# Patient Record
Sex: Female | Born: 1945 | Race: White | Hispanic: No | Marital: Married | Smoking: Never smoker
Health system: Southern US, Community
[De-identification: ages and names within clinical notes are randomized; demographics above are authoritative.]

## PROBLEM LIST (undated history)

## (undated) DIAGNOSIS — I10 Essential (primary) hypertension: Secondary | ICD-10-CM

## (undated) HISTORY — PX: OTHER SURGICAL HISTORY: SHX169

---

## 2015-06-10 ENCOUNTER — Encounter: Payer: Self-pay | Admitting: Podiatry

## 2015-06-10 ENCOUNTER — Ambulatory Visit (INDEPENDENT_AMBULATORY_CARE_PROVIDER_SITE_OTHER): Payer: Medicare Other

## 2015-06-10 ENCOUNTER — Ambulatory Visit (INDEPENDENT_AMBULATORY_CARE_PROVIDER_SITE_OTHER): Payer: Medicare Other | Admitting: Podiatry

## 2015-06-10 VITALS — BP 143/79 | HR 73 | Resp 16

## 2015-06-10 DIAGNOSIS — S99921A Unspecified injury of right foot, initial encounter: Secondary | ICD-10-CM

## 2015-06-10 DIAGNOSIS — S92911A Unspecified fracture of right toe(s), initial encounter for closed fracture: Secondary | ICD-10-CM

## 2015-06-10 MED ORDER — TRAMADOL HCL 50 MG PO TABS: 50.0000 mg | ORAL_TABLET | Freq: Three times a day (TID) | ORAL | Status: DC | PRN

## 2015-06-10 NOTE — Progress Notes (Signed)
   Subjective:    Patient ID: Lisa RivalSandra Parsons, female    DOB: 08/09/1945, 69 y.o.   MRN: [id number]  HPI: She presents today as a 69 year old white female here from [place] visiting her daughter for Christmas. She states that she stepped over her granddaughter last night tripping and landing on her foot. She states that her foot hurts so badly it throbs and is painful. She states that she can hardly touch it and she is afraid that she is not to have any relief and not be able to drive back to [place].    Review of Systems  Musculoskeletal: Positive for gait problem.  All other systems reviewed and are negative.      Objective:   Physical Exam: 69 year old white female who presents today utilizing a wheelchair. Pulses are palpable vital signs are stable she is alert and oriented 3. Edema to the right lower extremity and the foot dorsal and dorsolateral aspect of the foot. Radiographs taken today do demonstrate a fracture of the fifth toe and the neck of the fifth metatarsal which appears to be nondisplaced and non-comminuted. No skin breakdown is noted. Mild ecchymosis overlying the fifth toe at the proximal interphalangeal joint.        Assessment & Plan:  Fractured fifth toe and fifth metatarsal nondisplaced non-comminuted.  Plan: Discussed etiology pathology conservative versus surgical therapies. At this point I redressed the foot with compression dressing and placed her in a short Cam Walker. We will also obtain a Darco shoe or her drive back to [place]. I wrote her a prescription for Ultram 50 mg. I will follow-up with her as needed prior to her departure for [place].

## 2015-06-12 DIAGNOSIS — M79673 Pain in unspecified foot: Secondary | ICD-10-CM

## 2020-04-10 ENCOUNTER — Emergency Department (HOSPITAL_COMMUNITY): Payer: Medicare Other

## 2020-04-10 ENCOUNTER — Inpatient Hospital Stay (HOSPITAL_COMMUNITY)
Admission: EM | Admit: 2020-04-10 | Discharge: 2020-04-13 | DRG: 661 | Disposition: A | Discharge status: Home / Self Care | Payer: Medicare Other | Attending: Internal Medicine | Admitting: Internal Medicine

## 2020-04-10 ENCOUNTER — Encounter (HOSPITAL_COMMUNITY): Payer: Self-pay | Admitting: *Deleted

## 2020-04-10 ENCOUNTER — Inpatient Hospital Stay (HOSPITAL_COMMUNITY): Payer: Medicare Other | Admitting: Anesthesiology

## 2020-04-10 ENCOUNTER — Other Ambulatory Visit: Payer: Self-pay

## 2020-04-10 ENCOUNTER — Encounter (HOSPITAL_COMMUNITY)
Admission: EM | Disposition: A | Discharge status: Home / Self Care | Payer: Self-pay | Source: Home / Self Care | Attending: Internal Medicine

## 2020-04-10 ENCOUNTER — Inpatient Hospital Stay (HOSPITAL_COMMUNITY): Payer: Medicare Other

## 2020-04-10 DIAGNOSIS — Z91041 Radiographic dye allergy status: Secondary | ICD-10-CM

## 2020-04-10 DIAGNOSIS — K5900 Constipation, unspecified: Secondary | ICD-10-CM | POA: Diagnosis present

## 2020-04-10 DIAGNOSIS — Z79899 Other long term (current) drug therapy: Secondary | ICD-10-CM | POA: Diagnosis not present

## 2020-04-10 DIAGNOSIS — N201 Calculus of ureter: Secondary | ICD-10-CM | POA: Diagnosis not present

## 2020-04-10 DIAGNOSIS — Z880 Allergy status to penicillin: Secondary | ICD-10-CM | POA: Diagnosis not present

## 2020-04-10 DIAGNOSIS — Z8249 Family history of ischemic heart disease and other diseases of the circulatory system: Secondary | ICD-10-CM | POA: Diagnosis not present

## 2020-04-10 DIAGNOSIS — Z91013 Allergy to seafood: Secondary | ICD-10-CM

## 2020-04-10 DIAGNOSIS — D72829 Elevated white blood cell count, unspecified: Secondary | ICD-10-CM | POA: Diagnosis present

## 2020-04-10 DIAGNOSIS — Z20822 Contact with and (suspected) exposure to covid-19: Secondary | ICD-10-CM | POA: Diagnosis present

## 2020-04-10 DIAGNOSIS — E669 Obesity, unspecified: Secondary | ICD-10-CM | POA: Diagnosis present

## 2020-04-10 DIAGNOSIS — N179 Acute kidney failure, unspecified: Secondary | ICD-10-CM | POA: Diagnosis present

## 2020-04-10 DIAGNOSIS — Z90710 Acquired absence of both cervix and uterus: Secondary | ICD-10-CM

## 2020-04-10 DIAGNOSIS — I10 Essential (primary) hypertension: Secondary | ICD-10-CM | POA: Diagnosis present

## 2020-04-10 DIAGNOSIS — Z6829 Body mass index (BMI) 29.0-29.9, adult: Secondary | ICD-10-CM | POA: Diagnosis not present

## 2020-04-10 DIAGNOSIS — R109 Unspecified abdominal pain: Secondary | ICD-10-CM | POA: Diagnosis present

## 2020-04-10 DIAGNOSIS — Z87442 Personal history of urinary calculi: Secondary | ICD-10-CM

## 2020-04-10 DIAGNOSIS — Z419 Encounter for procedure for purposes other than remedying health state, unspecified: Secondary | ICD-10-CM

## 2020-04-10 DIAGNOSIS — N132 Hydronephrosis with renal and ureteral calculous obstruction: Principal | ICD-10-CM | POA: Diagnosis present

## 2020-04-10 DIAGNOSIS — F419 Anxiety disorder, unspecified: Secondary | ICD-10-CM | POA: Diagnosis present

## 2020-04-10 DIAGNOSIS — K5730 Diverticulosis of large intestine without perforation or abscess without bleeding: Secondary | ICD-10-CM | POA: Diagnosis present

## 2020-04-10 HISTORY — DX: Essential (primary) hypertension: I10

## 2020-04-10 HISTORY — PX: CYSTOSCOPY W/ URETERAL STENT PLACEMENT: SHX1429

## 2020-04-10 LAB — CBC WITH DIFFERENTIAL/PLATELET
Abs Immature Granulocytes: 0.05 10*3/uL (ref 0.00–0.07)
Basophils Absolute: 0.1 10*3/uL (ref 0.0–0.1)
Basophils Relative: 1 %
Eosinophils Absolute: 0 10*3/uL (ref 0.0–0.5)
Eosinophils Relative: 0 %
HCT: 43.3 % (ref 36.0–46.0)
Hemoglobin: 14.4 g/dL (ref 12.0–15.0)
Immature Granulocytes: 1 %
Lymphocytes Relative: 10 %
Lymphs Abs: 1.1 10*3/uL (ref 0.7–4.0)
MCH: 32.4 pg (ref 26.0–34.0)
MCHC: 33.3 g/dL (ref 30.0–36.0)
MCV: 97.3 fL (ref 80.0–100.0)
Monocytes Absolute: 1 10*3/uL (ref 0.1–1.0)
Monocytes Relative: 9 %
Neutro Abs: 8.9 10*3/uL — ABNORMAL HIGH (ref 1.7–7.7)
Neutrophils Relative %: 79 %
Platelets: 228 10*3/uL (ref 150–400)
RBC: 4.45 MIL/uL (ref 3.87–5.11)
RDW: 12.5 % (ref 11.5–15.5)
WBC: 11.1 10*3/uL — ABNORMAL HIGH (ref 4.0–10.5)
nRBC: 0 % (ref 0.0–0.2)

## 2020-04-10 LAB — COMPREHENSIVE METABOLIC PANEL
ALT: 31 U/L (ref 0–44)
AST: 30 U/L (ref 15–41)
Albumin: 3.8 g/dL (ref 3.5–5.0)
Alkaline Phosphatase: 77 U/L (ref 38–126)
Anion gap: 10 (ref 5–15)
BUN: 16 mg/dL (ref 8–23)
CO2: 24 mmol/L (ref 22–32)
Calcium: 9.5 mg/dL (ref 8.9–10.3)
Chloride: 103 mmol/L (ref 98–111)
Creatinine, Ser: 1.1 mg/dL — ABNORMAL HIGH (ref 0.44–1.00)
GFR, Estimated: 53 mL/min — ABNORMAL LOW (ref >60)
Glucose, Bld: 157 mg/dL — ABNORMAL HIGH (ref 70–99)
Potassium: 3.8 mmol/L (ref 3.5–5.1)
Sodium: 137 mmol/L (ref 135–145)
Total Bilirubin: 0.4 mg/dL (ref 0.3–1.2)
Total Protein: 6.9 g/dL (ref 6.5–8.1)

## 2020-04-10 LAB — URINALYSIS, ROUTINE W REFLEX MICROSCOPIC
Bacteria, UA: NONE SEEN
Bilirubin Urine: NEGATIVE
Glucose, UA: 50 mg/dL — AB
Ketones, ur: NEGATIVE mg/dL
Leukocytes,Ua: NEGATIVE
Nitrite: NEGATIVE
Protein, ur: NEGATIVE mg/dL
RBC / HPF: >50 RBC/hpf — ABNORMAL HIGH (ref 0–5)
Specific Gravity, Urine: 1.011 (ref 1.005–1.030)
pH: 6 (ref 5.0–8.0)

## 2020-04-10 LAB — RESPIRATORY PANEL BY RT PCR (FLU A&B, COVID)
Influenza A by PCR: NEGATIVE
Influenza B by PCR: NEGATIVE
SARS Coronavirus 2 by RT PCR: NEGATIVE

## 2020-04-10 LAB — LIPASE, BLOOD: Lipase: 34 U/L (ref 11–51)

## 2020-04-10 LAB — MAGNESIUM: Magnesium: 2 mg/dL (ref 1.7–2.4)

## 2020-04-10 LAB — LACTIC ACID, PLASMA: Lactic Acid, Venous: 1.9 mmol/L (ref 0.5–1.9)

## 2020-04-10 SURGERY — CYSTOSCOPY, WITH RETROGRADE PYELOGRAM AND URETERAL STENT INSERTION
Anesthesia: Monitor Anesthesia Care | Laterality: Left

## 2020-04-10 MED ORDER — LISINOPRIL 20 MG PO TABS
40.0000 mg | ORAL_TABLET | Freq: Once | ORAL | Status: AC
  Administered 2020-04-10: 40 mg via ORAL
  Filled 2020-04-10: qty 2

## 2020-04-10 MED ORDER — ACETAMINOPHEN 325 MG PO TABS: 650.0000 mg | ORAL_TABLET | Freq: Four times a day (QID) | ORAL | Status: DC | PRN

## 2020-04-10 MED ORDER — KETOROLAC TROMETHAMINE 30 MG/ML IJ SOLN
30.0000 mg | Freq: Four times a day (QID) | INTRAMUSCULAR | Status: DC | PRN
  Administered 2020-04-10 – 2020-04-12 (×6): 30 mg via INTRAVENOUS
  Filled 2020-04-10 (×6): qty 1

## 2020-04-10 MED ORDER — AMLODIPINE BESYLATE 5 MG PO TABS
5.0000 mg | ORAL_TABLET | Freq: Every day | ORAL | Status: DC
  Administered 2020-04-10 – 2020-04-13 (×4): 5 mg via ORAL
  Filled 2020-04-10 (×4): qty 1

## 2020-04-10 MED ORDER — ONDANSETRON HCL 4 MG/2ML IJ SOLN
INTRAMUSCULAR | Status: DC | PRN
  Administered 2020-04-10: 4 mg via INTRAVENOUS

## 2020-04-10 MED ORDER — ONDANSETRON HCL 4 MG/2ML IJ SOLN
4.0000 mg | Freq: Once | INTRAMUSCULAR | Status: AC
  Administered 2020-04-10: 4 mg via INTRAVENOUS
  Filled 2020-04-10: qty 2

## 2020-04-10 MED ORDER — SODIUM CHLORIDE 0.9 % IR SOLN
Status: DC | PRN
  Administered 2020-04-10: 3000 mL

## 2020-04-10 MED ORDER — PROMETHAZINE HCL 25 MG/ML IJ SOLN
12.5000 mg | Freq: Four times a day (QID) | INTRAMUSCULAR | Status: DC | PRN
  Administered 2020-04-10: 12.5 mg via INTRAVENOUS
  Filled 2020-04-10: qty 1

## 2020-04-10 MED ORDER — HYDROMORPHONE HCL 1 MG/ML IJ SOLN
0.5000 mg | INTRAMUSCULAR | Status: DC | PRN
  Administered 2020-04-11: 1 mg via INTRAVENOUS
  Filled 2020-04-10: qty 1

## 2020-04-10 MED ORDER — ONDANSETRON HCL 4 MG PO TABS: 4.0000 mg | ORAL_TABLET | Freq: Four times a day (QID) | ORAL | Status: DC | PRN

## 2020-04-10 MED ORDER — FENTANYL CITRATE (PF) 250 MCG/5ML IJ SOLN
INTRAMUSCULAR | Status: AC
  Filled 2020-04-10: qty 5

## 2020-04-10 MED ORDER — 0.9 % SODIUM CHLORIDE (POUR BTL) OPTIME
TOPICAL | Status: DC | PRN
  Administered 2020-04-10: 1000 mL

## 2020-04-10 MED ORDER — PHENYLEPHRINE 40 MCG/ML (10ML) SYRINGE FOR IV PUSH (FOR BLOOD PRESSURE SUPPORT)
PREFILLED_SYRINGE | INTRAVENOUS | Status: AC
  Filled 2020-04-10: qty 10

## 2020-04-10 MED ORDER — FENTANYL CITRATE (PF) 100 MCG/2ML IJ SOLN
50.0000 ug | Freq: Once | INTRAMUSCULAR | Status: AC
  Administered 2020-04-10: 50 ug via INTRAVENOUS
  Filled 2020-04-10: qty 2

## 2020-04-10 MED ORDER — OXYCODONE HCL 5 MG PO TABS: 5.0000 mg | ORAL_TABLET | ORAL | Status: DC | PRN

## 2020-04-10 MED ORDER — DOCUSATE SODIUM 100 MG PO CAPS: 100.0000 mg | ORAL_CAPSULE | Freq: Every day | ORAL | 2 refills | Status: AC | PRN

## 2020-04-10 MED ORDER — SODIUM CHLORIDE 0.9 % IV SOLN
2.0000 g | INTRAVENOUS | Status: DC
  Administered 2020-04-10: 2 g via INTRAVENOUS
  Filled 2020-04-10 (×2): qty 20

## 2020-04-10 MED ORDER — ACETAMINOPHEN 650 MG RE SUPP: 650.0000 mg | Freq: Four times a day (QID) | RECTAL | Status: DC | PRN

## 2020-04-10 MED ORDER — OLOPATADINE HCL 0.1 % OP SOLN
1.0000 [drp] | Freq: Two times a day (BID) | OPHTHALMIC | Status: DC | PRN
  Filled 2020-04-10: qty 5

## 2020-04-10 MED ORDER — DEXAMETHASONE SODIUM PHOSPHATE 4 MG/ML IJ SOLN
INTRAMUSCULAR | Status: DC | PRN
  Administered 2020-04-10: 4 mg via INTRAVENOUS

## 2020-04-10 MED ORDER — EPHEDRINE SULFATE-NACL 50-0.9 MG/10ML-% IV SOSY
PREFILLED_SYRINGE | INTRAVENOUS | Status: DC | PRN
  Administered 2020-04-10 (×2): 5 mg via INTRAVENOUS
  Administered 2020-04-10: 10 mg via INTRAVENOUS
  Administered 2020-04-10: 5 mg via INTRAVENOUS

## 2020-04-10 MED ORDER — HYDRALAZINE HCL 20 MG/ML IJ SOLN: 10.0000 mg | Freq: Four times a day (QID) | INTRAMUSCULAR | Status: DC | PRN

## 2020-04-10 MED ORDER — LACTATED RINGERS IV SOLN: INTRAVENOUS | Status: DC | PRN

## 2020-04-10 MED ORDER — ONDANSETRON HCL 4 MG/2ML IJ SOLN
4.0000 mg | Freq: Four times a day (QID) | INTRAMUSCULAR | Status: DC | PRN
  Administered 2020-04-11: 4 mg via INTRAVENOUS
  Filled 2020-04-10: qty 2

## 2020-04-10 MED ORDER — LEVOFLOXACIN IN D5W 750 MG/150ML IV SOLN
750.0000 mg | Freq: Once | INTRAVENOUS | Status: AC
  Administered 2020-04-10: 750 mg via INTRAVENOUS
  Filled 2020-04-10: qty 150

## 2020-04-10 MED ORDER — HYDROMORPHONE HCL 1 MG/ML IJ SOLN
1.0000 mg | Freq: Once | INTRAMUSCULAR | Status: AC
  Administered 2020-04-10: 1 mg via INTRAVENOUS
  Filled 2020-04-10: qty 1

## 2020-04-10 MED ORDER — LISINOPRIL 40 MG PO TABS
40.0000 mg | ORAL_TABLET | Freq: Every day | ORAL | Status: DC
  Administered 2020-04-11 – 2020-04-13 (×3): 40 mg via ORAL
  Filled 2020-04-10 (×3): qty 1

## 2020-04-10 MED ORDER — PROPOFOL 10 MG/ML IV BOLUS
INTRAVENOUS | Status: AC
  Filled 2020-04-10: qty 20

## 2020-04-10 MED ORDER — SODIUM CHLORIDE 0.9 % IV BOLUS
1000.0000 mL | Freq: Once | INTRAVENOUS | Status: AC
  Administered 2020-04-10: 1000 mL via INTRAVENOUS

## 2020-04-10 MED ORDER — SODIUM CHLORIDE 0.9 % IV SOLN: INTRAVENOUS | Status: DC

## 2020-04-10 MED ORDER — FENTANYL CITRATE (PF) 100 MCG/2ML IJ SOLN
INTRAMUSCULAR | Status: DC | PRN
  Administered 2020-04-10 (×2): 25 ug via INTRAVENOUS

## 2020-04-10 MED ORDER — MORPHINE SULFATE (PF) 4 MG/ML IV SOLN: 4.0000 mg | Freq: Once | INTRAVENOUS | Status: DC

## 2020-04-10 MED ORDER — PROMETHAZINE HCL 25 MG/ML IJ SOLN
12.5000 mg | Freq: Once | INTRAMUSCULAR | Status: AC
  Administered 2020-04-10: 12.5 mg via INTRAVENOUS
  Filled 2020-04-10 (×2): qty 1

## 2020-04-10 MED ORDER — EPHEDRINE 5 MG/ML INJ
INTRAVENOUS | Status: AC
  Filled 2020-04-10: qty 10

## 2020-04-10 MED ORDER — OXYCODONE-ACETAMINOPHEN 5-325 MG PO TABS
1.0000 | ORAL_TABLET | ORAL | 0 refills | Status: AC | PRN
Start: 2020-04-10 — End: ?

## 2020-04-10 MED ORDER — PHENYLEPHRINE 40 MCG/ML (10ML) SYRINGE FOR IV PUSH (FOR BLOOD PRESSURE SUPPORT)
PREFILLED_SYRINGE | INTRAVENOUS | Status: DC | PRN
  Administered 2020-04-10 (×2): 80 ug via INTRAVENOUS
  Administered 2020-04-10 (×2): 40 ug via INTRAVENOUS

## 2020-04-10 MED ORDER — SODIUM CHLORIDE 0.9 % IV SOLN
INTRAVENOUS | Status: DC | PRN
  Administered 2020-04-10: 7 mL

## 2020-04-10 MED ORDER — PROPOFOL 500 MG/50ML IV EMUL
INTRAVENOUS | Status: DC | PRN
  Administered 2020-04-10: 150 ug/kg/min via INTRAVENOUS
  Administered 2020-04-10: 125 ug/kg/min via INTRAVENOUS

## 2020-04-10 SURGICAL SUPPLY — 26 items
BAG URINE DRAINAGE (UROLOGICAL SUPPLIES) ×3 IMPLANT
BLADE CLIPPER SURG (BLADE) IMPLANT
BUCKET BIOHAZARD WASTE 5 GAL (MISCELLANEOUS) ×3 IMPLANT
CATH FOLEY 2WAY SLVR  5CC 16FR (CATHETERS)
CATH FOLEY 2WAY SLVR 5CC 16FR (CATHETERS) IMPLANT
CATH URET 5FR 28IN OPEN ENDED (CATHETERS) ×3 IMPLANT
COVER SURGICAL LIGHT HANDLE (MISCELLANEOUS) IMPLANT
DRAPE CAMERA CLOSED 9X96 (DRAPES) IMPLANT
GLOVE BIOGEL M 7.0 STRL (GLOVE) IMPLANT
GOWN STRL REUS W/ TWL XL LVL3 (GOWN DISPOSABLE) ×2 IMPLANT
GOWN STRL REUS W/TWL XL LVL3 (GOWN DISPOSABLE) ×4
GUIDEWIRE ANG ZIPWIRE 038X150 (WIRE) ×3 IMPLANT
GUIDEWIRE COOK  .035 (WIRE) IMPLANT
KIT TURNOVER KIT B (KITS) ×3 IMPLANT
NS IRRIG 1000ML POUR BTL (IV SOLUTION) ×3 IMPLANT
PACK CYSTO (CUSTOM PROCEDURE TRAY) ×3 IMPLANT
PAD ARMBOARD 7.5X6 YLW CONV (MISCELLANEOUS) ×6 IMPLANT
STENT INLAY 6X24 (STENTS) IMPLANT
STENT URET 6FRX26 CONTOUR (STENTS) ×3 IMPLANT
STENT URET 6FRX28 CONTOUR (STENTS) ×3 IMPLANT
SURGILUBE 2OZ TUBE FLIPTOP (MISCELLANEOUS) IMPLANT
SYR CONTROL 10ML LL (SYRINGE) ×3 IMPLANT
TOWEL GREEN STERILE FF (TOWEL DISPOSABLE) ×3 IMPLANT
UNDERPAD 30X36 HEAVY ABSORB (UNDERPADS AND DIAPERS) ×3 IMPLANT
WATER STERILE IRR 1000ML POUR (IV SOLUTION) ×3 IMPLANT
WIRE COONS/BENSON .038X145CM (WIRE) IMPLANT

## 2020-04-10 NOTE — Anesthesia Postprocedure Evaluation (Signed)
Anesthesia Post Note  Patient: Lisa Parsons  Procedure(s) Performed: CYSTOSCOPY WITH RETROGRADE PYELOGRAM/URETERAL STENT PLACEMENT LEFT (Left )     Patient location during evaluation: PACU Anesthesia Type: MAC Level of consciousness: awake and alert Pain management: pain level controlled Vital Signs Assessment: post-procedure vital signs reviewed and stable Respiratory status: spontaneous breathing, nonlabored ventilation and respiratory function stable Cardiovascular status: stable and blood pressure returned to baseline Postop Assessment: no apparent nausea or vomiting Anesthetic complications: no   No complications documented.  Last Vitals:  Vitals:   04/10/20 1940 04/10/20 1951  BP: (!) 112/55 119/60  Pulse: 61 60  Resp: 20 19  Temp:  (!) 36.4 C  SpO2: 95% 96%    Last Pain:  Vitals:   04/10/20 1951  TempSrc:   PainSc: 2                  Catalina Gravel

## 2020-04-10 NOTE — Consult Note (Signed)
Urology Consult   Physician requesting consult: Early Osmond, MD  Reason for consult: Obstructing left UPJ stone  History of Present Illness: Lisa Parsons is a 74 y.o. who presented to the emergency department earlier today with acute onset of left flank pain associate with nausea and vomiting.  The pain began around 8 PM last night and persisted through the night.  She states she had dry heaving overnight.  She denies any fevers or chills.  She denies any dysuria or hematuria.  She states that she was told that she may have a kidney stone approximately 2 years ago.  However she denies passing any prior stones and denies any prior urologic surgery for stones.  CT A/P on 04/10/2020 revealed a large calculus in the left UPJ measuring 11 x 6 mm and is associate with moderate hydronephrosis and marked perinephric stranding and edema. She remained afebrile in the emergency department. Her white blood cell count was 11.1. Her urinalysis demonstrated negative nitrates, negative leukocytes.  She initially had poorly controlled left-sided flank pain this morning that was subsequently controlled with narcotic medications.  She is very concerned about undergoing a general anesthetic and wishes to speak with anesthesiology about undergoing MAC which I think is reasonable.  She denies a history of voiding or storage urinary symptoms, hematuria, UTIs, STDs, urolithiasis, GU malignancy/trauma/surgery.  Past Medical History:  Diagnosis Date  . HTN (hypertension)     Past Surgical History:  Procedure Laterality Date  . hystrectomy       Current Hospital Medications:  Home meds:  No current facility-administered medications on file prior to encounter.   Current Outpatient Medications on File Prior to Encounter  Medication Sig Dispense Refill  . amLODipine (NORVASC) 5 MG tablet Take 5 mg by mouth daily.   3  . aspirin-acetaminophen-caffeine (EXCEDRIN MIGRAINE) 250-250-65 MG tablet Take 1 tablet  by mouth every 6 (six) hours as needed for headache.    . lisinopril (PRINIVIL,ZESTRIL) 20 MG tablet Take 40 mg by mouth daily.   0  . olopatadine (PATADAY) 0.1 % ophthalmic solution Place 1 drop into both eyes 2 (two) times daily as needed for allergies.       Scheduled Meds: . amLODipine  5 mg Oral Daily  . [START ON 04/11/2020] lisinopril  40 mg Oral Daily   Continuous Infusions: . sodium chloride 75 mL/hr at 04/10/20 1119  . cefTRIAXone (ROCEPHIN)  IV Stopped (04/10/20 1250)   PRN Meds:.acetaminophen **OR** acetaminophen, hydrALAZINE, HYDROmorphone (DILAUDID) injection, ketorolac, olopatadine, ondansetron **OR** ondansetron (ZOFRAN) IV, oxyCODONE, promethazine  Allergies:  Allergies  Allergen Reactions  . Contrast Media [Iodinated Diagnostic Agents]     unknown  . Iodine   . Penicillins   . Shellfish Allergy     Family History  Problem Relation Age of Onset  . Hypertension Mother     Social History:  reports that she has never smoked. She has never used smokeless tobacco. She reports current alcohol use. She reports that she does not use drugs.  ROS: A complete review of systems was performed.  All systems are negative except for pertinent findings as noted.  Physical Exam:  Vital signs in last 24 hours: Temp:  [99.1 F (37.3 C)] 99.1 F (37.3 C) (10/21 0700) Pulse Rate:  [56-64] 58 (10/21 1115) Resp:  [12-30] 14 (10/21 1115) BP: (124-158)/(60-93) 130/66 (10/21 1115) SpO2:  [95 %] 95 % (10/21 0700) Weight:  [79.4 kg] 79.4 kg (10/21 0708) Constitutional:  Alert and oriented, No acute distress Cardiovascular: Regular  rate and rhythm Respiratory: Normal respiratory effort, Lungs clear bilaterally GI: Abdomen is soft, nontender, nondistended, no abdominal masses GU: No CVA tenderness Neurologic: Grossly intact, no focal deficits Psychiatric: Normal mood and affect  Laboratory Data:  Recent Labs    04/10/20 0817  WBC 11.1*  HGB 14.4  HCT 43.3  PLT 228     Recent Labs    04/10/20 0817  NA 137  K 3.8  CL 103  GLUCOSE 157*  BUN 16  CALCIUM 9.5  CREATININE 1.10*     Results for orders placed or performed during the hospital encounter of 04/10/20 (from the past 24 hour(s))  CBC with Differential     Status: Abnormal   Collection Time: 04/10/20  8:17 AM  Result Value Ref Range   WBC 11.1 (H) 4.0 - 10.5 K/uL   RBC 4.45 3.87 - 5.11 MIL/uL   Hemoglobin 14.4 12.0 - 15.0 g/dL   HCT 43.3 36 - 46 %   MCV 97.3 80.0 - 100.0 fL   MCH 32.4 26.0 - 34.0 pg   MCHC 33.3 30.0 - 36.0 g/dL   RDW 12.5 11.5 - 15.5 %   Platelets 228 150 - 400 K/uL   nRBC 0.0 0.0 - 0.2 %   Neutrophils Relative % 79 %   Neutro Abs 8.9 (H) 1.7 - 7.7 K/uL   Lymphocytes Relative 10 %   Lymphs Abs 1.1 0.7 - 4.0 K/uL   Monocytes Relative 9 %   Monocytes Absolute 1.0 0.1 - 1.0 K/uL   Eosinophils Relative 0 %   Eosinophils Absolute 0.0 0.0 - 0.5 K/uL   Basophils Relative 1 %   Basophils Absolute 0.1 0.0 - 0.1 K/uL   Immature Granulocytes 1 %   Abs Immature Granulocytes 0.05 0.00 - 0.07 K/uL  Lactic acid, plasma     Status: None   Collection Time: 04/10/20  8:17 AM  Result Value Ref Range   Lactic Acid, Venous 1.9 0.5 - 1.9 mmol/L  Comprehensive metabolic panel     Status: Abnormal   Collection Time: 04/10/20  8:17 AM  Result Value Ref Range   Sodium 137 135 - 145 mmol/L   Potassium 3.8 3.5 - 5.1 mmol/L   Chloride 103 98 - 111 mmol/L   CO2 24 22 - 32 mmol/L   Glucose, Bld 157 (H) 70 - 99 mg/dL   BUN 16 8 - 23 mg/dL   Creatinine, Ser 1.10 (H) 0.44 - 1.00 mg/dL   Calcium 9.5 8.9 - 10.3 mg/dL   Total Protein 6.9 6.5 - 8.1 g/dL   Albumin 3.8 3.5 - 5.0 g/dL   AST 30 15 - 41 U/L   ALT 31 0 - 44 U/L   Alkaline Phosphatase 77 38 - 126 U/L   Total Bilirubin 0.4 0.3 - 1.2 mg/dL   GFR, Estimated 53 (L) >60 mL/min   Anion gap 10 5 - 15  Lipase, blood     Status: None   Collection Time: 04/10/20  8:17 AM  Result Value Ref Range   Lipase 34 11 - 51 U/L   Urinalysis, Routine w reflex microscopic Urine, Clean Catch     Status: Abnormal   Collection Time: 04/10/20  8:17 AM  Result Value Ref Range   Color, Urine STRAW (A) YELLOW   APPearance CLEAR CLEAR   Specific Gravity, Urine 1.011 1.005 - 1.030   pH 6.0 5.0 - 8.0   Glucose, UA 50 (A) NEGATIVE mg/dL   Hgb urine dipstick MODERATE (A)  NEGATIVE   Bilirubin Urine NEGATIVE NEGATIVE   Ketones, ur NEGATIVE NEGATIVE mg/dL   Protein, ur NEGATIVE NEGATIVE mg/dL   Nitrite NEGATIVE NEGATIVE   Leukocytes,Ua NEGATIVE NEGATIVE   RBC / HPF >50 (H) 0 - 5 RBC/hpf   WBC, UA 0-5 0 - 5 WBC/hpf   Bacteria, UA NONE SEEN NONE SEEN   Squamous Epithelial / LPF 0-5 0 - 5  Respiratory Panel by RT PCR (Flu A&B, Covid) - Nasopharyngeal Swab     Status: None   Collection Time: 04/10/20 10:02 AM   Specimen: Nasopharyngeal Swab  Result Value Ref Range   SARS Coronavirus 2 by RT PCR NEGATIVE NEGATIVE   Influenza A by PCR NEGATIVE NEGATIVE   Influenza B by PCR NEGATIVE NEGATIVE  Magnesium     Status: None   Collection Time: 04/10/20 10:49 AM  Result Value Ref Range   Magnesium 2.0 1.7 - 2.4 mg/dL   Recent Results (from the past 240 hour(s))  Respiratory Panel by RT PCR (Flu A&B, Covid) - Nasopharyngeal Swab     Status: None   Collection Time: 04/10/20 10:02 AM   Specimen: Nasopharyngeal Swab  Result Value Ref Range Status   SARS Coronavirus 2 by RT PCR NEGATIVE NEGATIVE Final    Comment: (NOTE) SARS-CoV-2 target nucleic acids are NOT DETECTED.  The SARS-CoV-2 RNA is generally detectable in upper respiratoy specimens during the acute phase of infection. The lowest concentration of SARS-CoV-2 viral copies this assay can detect is 131 copies/mL. A negative result does not preclude SARS-Cov-2 infection and should not be used as the sole basis for treatment or other patient management decisions. A negative result may occur with  improper specimen collection/handling, submission of specimen other than  nasopharyngeal swab, presence of viral mutation(s) within the areas targeted by this assay, and inadequate number of viral copies (<131 copies/mL). A negative result must be combined with clinical observations, patient history, and epidemiological information. The expected result is Negative.  Fact Sheet for Patients:  PinkCheek.be  Fact Sheet for Healthcare Providers:  GravelBags.it  This test is no t yet approved or cleared by the [place] FDA and  has been authorized for detection and/or diagnosis of SARS-CoV-2 by FDA under an Emergency Use Authorization (EUA). This EUA will remain  in effect (meaning this test can be used) for the duration of the COVID-19 declaration under Section 564(b)(1) of the Act, 21 U.S.C. section 360bbb-3(b)(1), unless the authorization is terminated or revoked sooner.     Influenza A by PCR NEGATIVE NEGATIVE Final   Influenza B by PCR NEGATIVE NEGATIVE Final    Comment: (NOTE) The Xpert Xpress SARS-CoV-2/FLU/RSV assay is intended as an aid in  the diagnosis of influenza from Nasopharyngeal swab specimens and  should not be used as a sole basis for treatment. Nasal washings and  aspirates are unacceptable for Xpert Xpress SARS-CoV-2/FLU/RSV  testing.  Fact Sheet for Patients: PinkCheek.be  Fact Sheet for Healthcare Providers: GravelBags.it  This test is not yet approved or cleared by the [place] FDA and  has been authorized for detection and/or diagnosis of SARS-CoV-2 by  FDA under an Emergency Use Authorization (EUA). This EUA will remain  in effect (meaning this test can be used) for the duration of the  Covid-19 declaration under Section 564(b)(1) of the Act, 21  U.S.C. section 360bbb-3(b)(1), unless the authorization is  terminated or revoked. Performed at [facility], [place] [address], [place],  [place] [address]     Renal  Function: Recent Labs    04/10/20 0817  CREATININE 1.10*   Estimated Creatinine Clearance: 46.3 mL/min (A) (by C-G formula based on SCr of 1.1 mg/dL (H)).  Radiologic Imaging: CT ABDOMEN PELVIS WO CONTRAST  Result Date: 04/10/2020 CLINICAL DATA:  74 year old female with suspected diverticulitis, acute onset of abdominal pain most notably in the LEFT lower quadrant EXAM: CT ABDOMEN AND PELVIS WITHOUT CONTRAST TECHNIQUE: Multidetector CT imaging of the abdomen and pelvis was performed following the standard protocol without IV contrast. COMPARISON:  None FINDINGS: Lower chest: Minimal basilar atelectasis. No consolidation. No pleural effusion. Atherosclerotic plaque in the thoracic aorta. Hepatobiliary: Liver is unremarkable on noncontrast imaging with normal contour and size. No pericholecystic stranding. No gross biliary duct dilation. Pancreas: Pancreas normal without ductal dilation or sign of inflammation. Spleen: Spleen normal in size and contour. Adrenals/Urinary Tract: Adrenal glands are normal. RIGHT kidney is normal without signs of nephrolithiasis or hydronephrosis. Large calculus at the LEFT UPJ measures 11 x 6 mm and is associated with moderate hydronephrosis and marked perinephric stranding and edema. No additional renal or ureteral calculi on the LEFT. Small LEFT posterolateral urinary bladder diverticulum. Stomach/Bowel: Small hiatal hernia. Stomach under distended limiting assessment. No small bowel dilation or acute small bowel process. Normal appendix.  Scattered colonic diverticulosis. Vascular/Lymphatic: Calcified atheromatous plaque of the abdominal aorta without aneurysmal dilation There is no gastrohepatic or hepatoduodenal ligament lymphadenopathy. No retroperitoneal or mesenteric lymphadenopathy. No pelvic sidewall lymphadenopathy. Reproductive: Post hysterectomy without adnexal mass. Other: No ascites.  No free air. Musculoskeletal: Spinal degenerative  changes. No acute or destructive bone process. IMPRESSION: 1. Large calculus at the LEFT UPJ measures 11 x 6 mm and is associated with moderate hydronephrosis and marked perinephric stranding and edema. 2. Scattered colonic diverticulosis without evidence of acute diverticulitis. 3. Aortic atherosclerosis. Aortic Atherosclerosis (ICD10-I70.0). Electronically Signed   By: Zetta Bills M.D.   On: 04/10/2020 09:32    I independently reviewed the above imaging studies.  Impression/Recommendation: 1. Obstructing left UPJ stone: CT A/P 04/10/2020 revealed a 11 x 6 mm stone at the left UPJ associate with moderate hydronephrosis.  No sign of infection.  Creatinine 1.1 from unknown baseline.  Poorly controlled pain.  -Long discussion was held with the patient and her daughter regarding management of this obstructing left-sided stone.  We discussed that she is very unlikely to pass an 11 mm size stone.  There is no sign for infection system placement would be performed for retractable poorly controlled pain. -Patient does live in [place] and would likely have her subsequent soon surgery there.  We discussed the possibility of undergoing ESWL next week in [place] if able.  -The risks, benefits and alternatives of cystoscopy with left JJ stent placement was discussed with the patient.  Risks include, but are not limited to: bleeding, urinary tract infection, ureteral injury, ureteral stricture disease, chronic pain, urinary symptoms, bladder injury, stent migration, the need for nephrostomy tube placement, MI, CVA, DVT, PE and the inherent risks with general anesthesia.  The patient voices understanding and wishes to proceed.  Will need definitive management as an outpatient either in [place] or [place].  Janith Lima 04/10/2020, 4:54 PM  Matt R. Daimien Patmon MD [facility]  Pager: [phone]   CC: Early Osmond, MD

## 2020-04-10 NOTE — Op Note (Signed)
Operative Note  Preoperative diagnosis:  1.  Obstructing left UPJ stone  Postoperative diagnosis: 1.  Obstructing left UPJ stone  Procedure(s): 1.  Cystoscopy 2. Left retrograde pyelogram with interpretation 3. Left ureteral stent placement  Surgeon: Jettie Pagan, MD  Assistants:  None  Anesthesia:  General  Complications:  None  EBL:  minimal  Specimens: 1.  ID Type Source Tests Collected by Time Destination  A : left renal pelvis culture Body Fluid PATH Other AEROBIC/ANAEROBIC CULTURE (SURGICAL/DEEP WOUND) Jannifer Hick, MD 04/10/2020 1858    Drains/Catheters: 1.  6Fr x 28 cm L ureteral stent  Intraoperative findings:   1. Mild left hydronephrosis, hydronephrotic drip. Urine sent for culture. 2. Large L UPJ stone identified on fluoro 3. Successful left ureteral stent placement.  Indication:  Lisa Parsons is a 74 y.o. female with an obstructing left UPJ stone without sign of infection or AKI but intractable pain. After reviewing the management options for treatment, she elected to proceed with the above surgical procedure(s). We have discussed the potential benefits and risks of the procedure, side effects of the proposed treatment, the likelihood of the patient achieving the goals of the procedure, and any potential problems that might occur during the procedure or recuperation. Informed consent has been obtained.  Description of procedure: The patient was taken to the operating room and general anesthesia was induced.  The patient was placed in the dorsal lithotomy position, prepped and draped in the usual sterile fashion, and preoperative antibiotics were administered. A preoperative time-out was performed.   Cystourethroscopy was performed.  The patient's urethra was examined and was normal. The bladder was then systematically examined in its entirety. There was no evidence for any bladder tumors, stones, or other mucosal pathology.  She did have 4+ trabeculation with  small diverticuli.  Attention then turned to the left ureteral orifice and a ureteral catheter was used to intubate the ureteral orifice.  Omnipaque contrast was injected through the ureteral catheter and a retrograde pyelogram was performed with findings as dictated above.  A 0.38 sensor guidewire was then advanced up the left  ureter into the renal pelvis under fluoroscopic guidance. 5 Fr was advanced and a left RP urine cx was obtained. Wire was replaced.  The wire was then backloaded through the cystoscope and a ureteral stent was advance over the wire using Seldinger technique. The 6 Fr x26 stent seemed to be too short and not curling well. We then replaced the stent with a 6 Fr 28 cm stent.  The stent was positioned appropriately under fluoroscopic and cystoscopic guidance. The wire was then removed with an adequate stent curl noted in the renal pelvis as well as in the bladder.  The bladder was then emptied and the procedure ended.  The patient appeared to tolerate the procedure well and without complications.  The patient was able to be awakened and transferred to the recovery unit in satisfactory condition.   Plan:  F/u with [facility] or with urologist in [place] to treat left renal stone.  Matt R. Diamonte Stavely MD [facility]  Pager: [phone]

## 2020-04-10 NOTE — ED Provider Notes (Signed)
[facility] EMERGENCY DEPARTMENT Provider Note   CSN: [id number] Arrival date & time: 04/10/20  4098     History Chief Complaint  Patient presents with  . Abdominal Pain    Lisa Parsons is a 74 y.o. female with PMH of HTN from [place] who presents to the ED accompanied by her daughter for acute onset abdominal pain.  Patient reports that she had gone out to [employer] last evening with her family and shortly after returning home around 9 PM she developed abdominal pain.  She reports that she could not sleep all night due to her pain symptoms.  She feels as though it radiates to her low back.  While her abdominal tenderness is generalized, she states that her pain symptoms are worse in her left lower quadrant.  She has a history of endometriosis s/p hysterectomy.  She has never had a colonoscopy or Cologuard testing.  She states that she had been mildly constipated for a few days prior to her onset of symptoms.  She has not taken any stool softeners or laxatives.  Patient reports that this morning she has been nauseated and has had multiple episodes of nonbloody emesis.  She reports that she had a possible kidney stone in the past, however this feels a little different.  She denies any headache, dizziness, fevers or chills, chest pain or shortness of breath, cough, recent illness or infection, precipitating injury, melena or hematochezia, or urinary symptoms.  On another note, she reports a severe iodine allergy and has been told in the past to avoid contrast studies.  HPI     History reviewed. No pertinent past medical history.  Patient Active Problem List   Diagnosis Date Noted  . HTN (hypertension) 04/10/2020  . AKI (acute kidney injury) (HCC) 04/10/2020    History reviewed. No pertinent surgical history.   OB History   No obstetric history on file.     No family history on file.  Social History   Tobacco Use  . Smoking status:  Never Smoker  . Smokeless tobacco: Never Used  Substance Use Topics  . Alcohol use: Yes    Alcohol/week: 0.0 standard drinks  . Drug use: Never    Home Medications Prior to Admission medications   Medication Sig Start Date End Date Taking? Authorizing Provider  amLODipine (NORVASC) 5 MG tablet Take 5 mg by mouth daily.  05/29/15  Yes [provider]  aspirin-acetaminophen-caffeine (EXCEDRIN MIGRAINE) [phone] MG tablet Take 1 tablet by mouth every 6 (six) hours as needed for headache.   Yes [provider]  lisinopril (PRINIVIL,ZESTRIL) 20 MG tablet Take 40 mg by mouth daily.  04/18/15  Yes [provider]  olopatadine (PATADAY) 0.1 % ophthalmic solution Place 1 drop into both eyes 2 (two) times daily as needed for allergies.   Yes [provider]    Allergies    Contrast media [iodinated diagnostic agents], Iodine, Penicillins, and Shellfish allergy  Review of Systems   Review of Systems  All other systems reviewed and are negative.   Physical Exam Updated Vital Signs BP (!) 147/68   Pulse 62   Temp 99.1 F (37.3 C) (Oral)   Resp (!) 21   Ht 5' 4.5" (1.638 m)   Wt 79.4 kg   SpO2 95%   BMI 29.57 kg/m   Physical Exam Vitals and nursing note reviewed. Exam conducted with a chaperone present.  Constitutional:      Comments: In pain  HENT:  Head: Normocephalic and atraumatic.  Eyes:     General: No scleral icterus.    Conjunctiva/sclera: Conjunctivae normal.  Cardiovascular:     Rate and Rhythm: Normal rate and regular rhythm.     Pulses: Normal pulses.     Heart sounds: Normal heart sounds.  Pulmonary:     Effort: Pulmonary effort is normal. No respiratory distress.     Breath sounds: Normal breath sounds. No wheezing or rales.  Abdominal:     General: Abdomen is flat. There is no distension.     Palpations: Abdomen is soft. There is no mass.     Tenderness: There is abdominal tenderness. There is left CVA tenderness. There  is no right CVA tenderness.     Comments: Generalized TTP, most notably in LLQ.  Positive left-sided CVAT.  No distention or masses.  Soft.  Musculoskeletal:     Right lower leg: No edema.     Left lower leg: No edema.  Skin:    General: Skin is dry.     Capillary Refill: Capillary refill takes less than 2 seconds.  Neurological:     Mental Status: She is alert and oriented to person, place, and time.     GCS: GCS eye subscore is 4. GCS verbal subscore is 5. GCS motor subscore is 6.  Psychiatric:        Mood and Affect: Mood normal.        Behavior: Behavior normal.        Thought Content: Thought content normal.     ED Results / Procedures / Treatments   Labs (all labs ordered are listed, but only abnormal results are displayed) Labs Reviewed  CBC WITH DIFFERENTIAL/PLATELET - Abnormal; Notable for the following components:      Result Value   WBC 11.1 (*)    Neutro Abs 8.9 (*)    All other components within normal limits  COMPREHENSIVE METABOLIC PANEL - Abnormal; Notable for the following components:   Glucose, Bld 157 (*)    Creatinine, Ser 1.10 (*)    GFR, Estimated 53 (*)    All other components within normal limits  URINALYSIS, ROUTINE W REFLEX MICROSCOPIC - Abnormal; Notable for the following components:   Color, Urine STRAW (*)    Glucose, UA 50 (*)    Hgb urine dipstick MODERATE (*)    RBC / HPF >50 (*)    All other components within normal limits  URINE CULTURE  RESPIRATORY PANEL BY RT PCR (FLU A&B, COVID)  LACTIC ACID, PLASMA  LIPASE, BLOOD    EKG None  Radiology CT ABDOMEN PELVIS WO CONTRAST  Result Date: 04/10/2020 CLINICAL DATA:  74 year old female with suspected diverticulitis, acute onset of abdominal pain most notably in the LEFT lower quadrant EXAM: CT ABDOMEN AND PELVIS WITHOUT CONTRAST TECHNIQUE: Multidetector CT imaging of the abdomen and pelvis was performed following the standard protocol without IV contrast. COMPARISON:  None FINDINGS: Lower  chest: Minimal basilar atelectasis. No consolidation. No pleural effusion. Atherosclerotic plaque in the thoracic aorta. Hepatobiliary: Liver is unremarkable on noncontrast imaging with normal contour and size. No pericholecystic stranding. No gross biliary duct dilation. Pancreas: Pancreas normal without ductal dilation or sign of inflammation. Spleen: Spleen normal in size and contour. Adrenals/Urinary Tract: Adrenal glands are normal. RIGHT kidney is normal without signs of nephrolithiasis or hydronephrosis. Large calculus at the LEFT UPJ measures 11 x 6 mm and is associated with moderate hydronephrosis and marked perinephric stranding and edema. No additional renal or ureteral  calculi on the LEFT. Small LEFT posterolateral urinary bladder diverticulum. Stomach/Bowel: Small hiatal hernia. Stomach under distended limiting assessment. No small bowel dilation or acute small bowel process. Normal appendix.  Scattered colonic diverticulosis. Vascular/Lymphatic: Calcified atheromatous plaque of the abdominal aorta without aneurysmal dilation There is no gastrohepatic or hepatoduodenal ligament lymphadenopathy. No retroperitoneal or mesenteric lymphadenopathy. No pelvic sidewall lymphadenopathy. Reproductive: Post hysterectomy without adnexal mass. Other: No ascites.  No free air. Musculoskeletal: Spinal degenerative changes. No acute or destructive bone process. IMPRESSION: 1. Large calculus at the LEFT UPJ measures 11 x 6 mm and is associated with moderate hydronephrosis and marked perinephric stranding and edema. 2. Scattered colonic diverticulosis without evidence of acute diverticulitis. 3. Aortic atherosclerosis. Aortic Atherosclerosis (ICD10-I70.0). Electronically Signed   By: Donzetta KohutGeoffrey  Wile M.D.   On: 04/10/2020 09:32    Procedures Procedures (including critical care time)  Medications Ordered in ED Medications  levofloxacin (LEVAQUIN) IVPB 750 mg (750 mg Intravenous New Bag/Given 04/10/20 1001)    amLODipine (NORVASC) tablet 5 mg (5 mg Oral Given 04/10/20 1026)  ondansetron (ZOFRAN) injection 4 mg (4 mg Intravenous Given 04/10/20 0819)  sodium chloride 0.9 % bolus 1,000 mL (0 mLs Intravenous Stopped 04/10/20 1003)  fentaNYL (SUBLIMAZE) injection 50 mcg (50 mcg Intravenous Given 04/10/20 0820)  fentaNYL (SUBLIMAZE) injection 50 mcg (50 mcg Intravenous Given 04/10/20 0855)  HYDROmorphone (DILAUDID) injection 1 mg (1 mg Intravenous Given 04/10/20 0957)  promethazine (PHENERGAN) injection 12.5 mg (12.5 mg Intravenous Given 04/10/20 0958)  sodium chloride 0.9 % bolus 1,000 mL (1,000 mLs Intravenous New Bag/Given 04/10/20 0957)  lisinopril (ZESTRIL) tablet 40 mg (40 mg Oral Given 04/10/20 1026)    ED Course  I have reviewed the triage vital signs and the nursing notes.  Pertinent labs & imaging results that were available during my care of the patient were reviewed by me and considered in my medical decision making (see chart for details).  Clinical Course as of Apr 10 1044  Thu Apr 10, 2020  0802 This is a 74 year old female presenting emergency department with abdominal pain.  She reports abrupt onset of abdominal pain after eating a large meal yesterday evening.  This occurred around 8 PM.  She said she has felt "extremely bloated" with nausea, several episodes of dry heaving, persistently worsening pain since last night.  She feels like she is not passing gas was not able have a bowel movement this morning.  She says she has never had pain like this before.  She does report a history of hysterectomy but denies any known history of bowel obstruction.  She suspect she may have had small kidney stones in the past but is never had this confirmed.  She denies any fevers or chills.  She reports that the pain right now is currently 10 out of 10 in intensity and cramping across the lower abdomen.  On exam the patient appears to be in discomfort.  She has diffuse lower abdominal tenderness without  guarding or rebound.  She has equal pulses bilaterally in the lower extremities.  There is no pulsatile mass palpable.  Plan to give her some IV pain medications.  She will need labs as well as a screening CT to look for bowel obstruction or kidney stone.  She does have iodine allergies ("severe").   [MT]  0820 Differential diagnoses at this time includes diverticulitis versus kidney stone versus AAA versus bowel obstruction versus mesenteric ischemia versus other.  Rapid bedside ultrasound showed no evidence of gross aortic enlargement  to suggest AAA.  Patient just had IV placed by nursing and was given fentanyl, and will go for CT shortly   [MT]  0900 Pt taken for CT now.  Additional 50 mcg of fentanyl ordered prior to CT   [MT]  0947 CT scan with large left sided UPJ stone > 1 cm, with perinephric stranding and hydronephrosis.  Urology paged by PA provider.  Pt still having intractable pain and nausea, writhing on stretcher.  1 mg dilaudid ordered, IV phenergan ordered, IV fluids and IV levaquin as empiric coverage for pyelo, given that she is describing rigors with her symptoms.   Patient and her daughter at bedside updated about diagnosis.  Anticipate admission.   [MT]  1008 Spoke with Dr. Cardell Peach, urology, who will plan for ureteral stent procedure this evening.  Recommends admission to hospitalist services for pain control in interim.   [GG]  1010 Spoke with Dr. Jacqulyn Bath who will see and admit patient for her intractable pain and nausea that will require stent procedure later this evening.  Keeping her NPO in interim.   [GG]    Clinical Course User Index [GG] Lorelee New, PA-C [MT] Terald Sleeper, MD   MDM Rules/Calculators/A&P                          Patient will receive CT imaging to assess for obstructing kidney stone versus diverticulitis versus other acute intra-abdominal pathology.  Labs Lactic acid: 1.9. Lipase: WNL. CMP: Renal function preserved with only mildly elevated  creatinine 1.10 and reduced GFR to 53.  No electrolyte derangement. CBC with differential: Mild leukocytosis to 11.1, otherwise normal. UA: Moderate hemoglobin noted greater than 50.  No evidence of infection.  Imaging CT abdomen pelvis is personally reviewed which demonstrates a large calculus at the left UPJ measuring 11 x 6 mm with associated moderate hydronephrosis and marked perinephric stranding edema.  We will consult urology as she may benefit from lithotripsy versus stenting.  She is in significant pain here in the ED poorly controlled with fentanyl.  Will administer IV Toradol to help with inflammation and pain.  Suspect that she may require admission.  Spoke with Dr. Cardell Peach, urology, who will plan for ureteral stent procedure this evening.  Recommends admission to hospitalist services for pain control in interim.  Spoke with Dr. Jacqulyn Bath who will see and admit patient for her intractable pain and nausea that will require stent procedure later this evening.  Keeping her NPO in interim.   Final Clinical Impression(s) / ED Diagnoses Final diagnoses:  Ureteral stone with hydronephrosis    Rx / DC Orders ED Discharge Orders    None       Lorelee New, PA-C 04/10/20 1045    Terald Sleeper, MD 04/10/20 1726

## 2020-04-10 NOTE — H&P (Signed)
History and Physical    Lisa Parsons MPN:[id number] DOB: 02/03/1946 DOA: 04/10/2020  PCP: Pcp, No  Patient coming from: Home  I have personally briefly reviewed patient's old medical records in [facility]  Chief Complaint: Severe left flank pain HPI: Lisa Parsons is a 74 y.o. female with medical history significant of hypertension, obesity, endometriosis status post hysterectomy presents to emergency department with severe left flank pain started last night.    Patient is from [facility] who presented to emergency department with generalized abdominal pain more on the left lower quadrant after she had dinner last night at 9:00 pm.  Reports that her pain is so severe that she could not sleep last night associated with nausea and had multiple of the sorts of nonbloody vomiting this morning therefore she came to ER for further evaluation and management.  She came from [place] to visit her daughter and was plan to return to [place] this morning.    Denies headache, blurry vision, chest pain, shortness of breath, palpitation, leg swelling, fever, chills, urinary symptoms such as dysuria, hematuria, lower back pain, melena or hematochezia.  No history of smoking, alcohol, illicit drug use.  ED Course: Upon arrival to ED: Patient's blood pressure in 150s over 70s, heart rate in the 60s, afebrile, maintaining oxygen saturation on room air, CBC shows leukocytosis of 11.1, lactic acid, lipase: WNL, CMP shows mild AKI.  UA positive for RBC more than 50.  COVID-19, urine culture: Pending.  CT abdomen/pelvis was ordered which shows large calculus at left UPJ J measures 11 x 6 mm and is associated with moderate hydronephrosis and mild perinephric stranding and edema.  No acute diverticulitis.  Patient was given IV fluid bolus, Zofran, Phenergan for nausea and vomiting, fentanyl and Dilaudid for pain control.  She was also given lisinopril and amlodipine due to elevated blood  pressure.  Urology was consulted who will pain to take patient to the OR today for cystoscopy and left stent placement.  Patient was given IV Levaquin in ED.  [facility] hospitalist consulted for admission for further management.  Review of Systems: As per HPI otherwise negative.    Past Medical History:  Diagnosis Date  . HTN (hypertension)     Past Surgical History:  Procedure Laterality Date  . hystrectomy       reports that she has never smoked. She has never used smokeless tobacco. She reports current alcohol use. She reports that she does not use drugs.  Allergies  Allergen Reactions  . Contrast Media [Iodinated Diagnostic Agents]     unknown  . Iodine   . Penicillins   . Shellfish Allergy     Family History  Problem Relation Age of Onset  . Hypertension Mother     Prior to Admission medications   Medication Sig Start Date End Date Taking? Authorizing Provider  amLODipine (NORVASC) 5 MG tablet Take 5 mg by mouth daily.  05/29/15  Yes [provider]  aspirin-acetaminophen-caffeine (EXCEDRIN MIGRAINE) [phone] MG tablet Take 1 tablet by mouth every 6 (six) hours as needed for headache.   Yes [provider]  lisinopril (PRINIVIL,ZESTRIL) 20 MG tablet Take 40 mg by mouth daily.  04/18/15  Yes [provider]  olopatadine (PATADAY) 0.1 % ophthalmic solution Place 1 drop into both eyes 2 (two) times daily as needed for allergies.   Yes [provider]    Physical Exam: Vitals:   04/10/20 0933 04/10/20 0945 04/10/20 1045 04/10/20 1100  BP:  Marland Kitchen)  147/68 134/70 124/70  Pulse: (!) 57 62 (!) 57 (!) 57  Resp: 18 (!) 21 14 12   Temp:      TempSrc:      SpO2:      Weight:      Height:        Constitutional: NAD, calm, comfortable, on room air, communicating well Eyes: PERRL, lids and conjunctivae normal ENMT: Mucous membranes are moist. Posterior pharynx clear of any exudate or lesions.Normal dentition.  Neck: normal, supple, no masses,  no thyromegaly Respiratory: clear to auscultation bilaterally, no wheezing, no crackles. Normal respiratory effort. No accessory muscle use.  Cardiovascular: Regular rate and rhythm, no murmurs / rubs / gallops. No extremity edema. 2+ pedal pulses. No carotid bruits.  Abdomen: Left flank tenderness, no masses palpated. No hepatosplenomegaly. Bowel sounds positive.  Musculoskeletal: no clubbing / cyanosis. No joint deformity upper and lower extremities. Good ROM, no contractures. Normal muscle tone.  Skin: no rashes, lesions, ulcers. No induration Neurologic: CN 2-12 grossly intact. Sensation intact, DTR normal. Strength 5/5 in all 4.  Psychiatric: Normal judgment and insight. Alert and oriented x 3. Normal mood.    Labs on Admission: I have personally reviewed following labs and imaging studies  CBC: Recent Labs  Lab 04/10/20 0817  WBC 11.1*  NEUTROABS 8.9*  HGB 14.4  HCT 43.3  MCV 97.3  PLT 228   Basic Metabolic Panel: Recent Labs  Lab 04/10/20 0817  NA 137  K 3.8  CL 103  CO2 24  GLUCOSE 157*  BUN 16  CREATININE 1.10*  CALCIUM 9.5   GFR: Estimated Creatinine Clearance: 46.3 mL/min (A) (by C-G formula based on SCr of 1.1 mg/dL (H)). Liver Function Tests: Recent Labs  Lab 04/10/20 0817  AST 30  ALT 31  ALKPHOS 77  BILITOT 0.4  PROT 6.9  ALBUMIN 3.8   Recent Labs  Lab 04/10/20 0817  LIPASE 34   No results for input(s): AMMONIA in the last 168 hours. Coagulation Profile: No results for input(s): INR, PROTIME in the last 168 hours. Cardiac Enzymes: No results for input(s): CKTOTAL, CKMB, CKMBINDEX, TROPONINI in the last 168 hours. BNP (last 3 results) No results for input(s): PROBNP in the last 8760 hours. HbA1C: No results for input(s): HGBA1C in the last 72 hours. CBG: No results for input(s): GLUCAP in the last 168 hours. Lipid Profile: No results for input(s): CHOL, HDL, LDLCALC, TRIG, CHOLHDL, LDLDIRECT in the last 72 hours. Thyroid Function  Tests: No results for input(s): TSH, T4TOTAL, FREET4, T3FREE, THYROIDAB in the last 72 hours. Anemia Panel: No results for input(s): VITAMINB12, FOLATE, FERRITIN, TIBC, IRON, RETICCTPCT in the last 72 hours. Urine analysis:    Component Value Date/Time   COLORURINE STRAW (A) 04/10/2020 0817   APPEARANCEUR CLEAR 04/10/2020 0817   LABSPEC 1.011 04/10/2020 0817   PHURINE 6.0 04/10/2020 0817   GLUCOSEU 50 (A) 04/10/2020 0817   HGBUR MODERATE (A) 04/10/2020 0817   BILIRUBINUR NEGATIVE 04/10/2020 0817   KETONESUR NEGATIVE 04/10/2020 0817   PROTEINUR NEGATIVE 04/10/2020 0817   NITRITE NEGATIVE 04/10/2020 0817   LEUKOCYTESUR NEGATIVE 04/10/2020 0817    Radiological Exams on Admission: CT ABDOMEN PELVIS WO CONTRAST  Result Date: 04/10/2020 CLINICAL DATA:  74 year old female with suspected diverticulitis, acute onset of abdominal pain most notably in the LEFT lower quadrant EXAM: CT ABDOMEN AND PELVIS WITHOUT CONTRAST TECHNIQUE: Multidetector CT imaging of the abdomen and pelvis was performed following the standard protocol without IV contrast. COMPARISON:  None FINDINGS: Lower chest:  Minimal basilar atelectasis. No consolidation. No pleural effusion. Atherosclerotic plaque in the thoracic aorta. Hepatobiliary: Liver is unremarkable on noncontrast imaging with normal contour and size. No pericholecystic stranding. No gross biliary duct dilation. Pancreas: Pancreas normal without ductal dilation or sign of inflammation. Spleen: Spleen normal in size and contour. Adrenals/Urinary Tract: Adrenal glands are normal. RIGHT kidney is normal without signs of nephrolithiasis or hydronephrosis. Large calculus at the LEFT UPJ measures 11 x 6 mm and is associated with moderate hydronephrosis and marked perinephric stranding and edema. No additional renal or ureteral calculi on the LEFT. Small LEFT posterolateral urinary bladder diverticulum. Stomach/Bowel: Small hiatal hernia. Stomach under distended limiting  assessment. No small bowel dilation or acute small bowel process. Normal appendix.  Scattered colonic diverticulosis. Vascular/Lymphatic: Calcified atheromatous plaque of the abdominal aorta without aneurysmal dilation There is no gastrohepatic or hepatoduodenal ligament lymphadenopathy. No retroperitoneal or mesenteric lymphadenopathy. No pelvic sidewall lymphadenopathy. Reproductive: Post hysterectomy without adnexal mass. Other: No ascites.  No free air. Musculoskeletal: Spinal degenerative changes. No acute or destructive bone process. IMPRESSION: 1. Large calculus at the LEFT UPJ measures 11 x 6 mm and is associated with moderate hydronephrosis and marked perinephric stranding and edema. 2. Scattered colonic diverticulosis without evidence of acute diverticulitis. 3. Aortic atherosclerosis. Aortic Atherosclerosis (ICD10-I70.0). Electronically Signed   By: Donzetta Kohut M.D.   On: 04/10/2020 09:32     Assessment/Plan Principal Problem:   Obstruction of left ureteropelvic junction (UPJ) due to stone Active Problems:   HTN (hypertension)   AKI (acute kidney injury) (HCC)   Obstruction of ureteropelvic junction (UPJ) due to stone    Obstruction of left UPJ due to stone: -Patient presented with severe left flank pain, nausea and vomiting started last night.  She is afebrile and has leukocytosis of 11.1.  She does not meet sepsis criteria.  Lactic acid: WNL, lipase: WNL. -Urine culture, COVID-19 pending.  UA positive for RBC more than 50. -Patient was given IV fluids, antiemetics, pain medications and Levaquin in ED. -Reviewed CT abdomen/pelvis result. -Urology consulted who will plan to take patient to the OR later today for cystoscopy and left stent placement. -Continue IV fluids, Zofran as needed for nausea and vomiting, Dilaudid as needed for pain control.  Start patient on Rocephin. -We will keep her n.p.o. -Monitor vitals closely.  Hypertension: Blood pressure elevated upon  arrival. -Patient was given amlodipine and lisinopril in ED.  Hold p.o. home meds for now as patient is n.p.o. Will start patient on hydralazine as needed for blood pressure more than 160/100.  Resume home p.o. meds once patient is back from surgery.  AKI: -Continue IV fluids.  Hold nephrotoxic medication.  Repeat BMP tomorrow a.m.  Endometriosis status post hysterectomy  DVT prophylaxis: SCD Code Status: Full CODE  Family Communication: Patient's daughter present at bedside.  Plan of care discussed with patient in length and he verbalized understanding and agreed with it. Disposition Plan: Home in 1 to 2 days Consults called: Neurology by EDP Admission status: Inpatient   Ollen Bowl MD [facility]  If 7PM-7AM, please contact night-coverage [email or web address]  04/10/2020, 11:14 AM

## 2020-04-10 NOTE — ED Triage Notes (Signed)
Patient presents to ed c/o left lower quad pain onset 8 pm last night after dinner, unable to state when her last BM was . Vomited x 1 this am.

## 2020-04-10 NOTE — Anesthesia Preprocedure Evaluation (Addendum)
Anesthesia Evaluation  Patient identified by MRN, date of birth, ID band Patient awake    Reviewed: Allergy & Precautions, NPO status , Patient's Chart, lab work & pertinent test results  Airway Mallampati: II  TM Distance: >3 FB Neck ROM: Full    Dental  (+) Teeth Intact   Pulmonary neg pulmonary ROS,    Pulmonary exam normal breath sounds clear to auscultation       Cardiovascular hypertension, Pt. on medications Normal cardiovascular exam Rhythm:Regular Rate:Normal     Neuro/Psych negative neurological ROS  negative psych ROS   GI/Hepatic negative GI ROS, Neg liver ROS,   Endo/Other  negative endocrine ROS  Renal/GU Renal disease (L ureteral stone)     Musculoskeletal negative musculoskeletal ROS (+)   Abdominal   Peds  Hematology negative hematology ROS (+)   Anesthesia Other Findings Day of surgery medications reviewed with the patient.  Reproductive/Obstetrics                            Anesthesia Physical Anesthesia Plan  ASA: II  Anesthesia Plan: MAC   Post-op Pain Management:    Induction: Intravenous  PONV Risk Score and Plan: 3 and Dexamethasone, Ondansetron, Propofol infusion and TIVA  Airway Management Planned: Natural Airway and Nasal Cannula  Additional Equipment:   Intra-op Plan:   Post-operative Plan:   Informed Consent: I have reviewed the patients History and Physical, chart, labs and discussed the procedure including the risks, benefits and alternatives for the proposed anesthesia with the patient or authorized representative who has indicated his/her understanding and acceptance.       Plan Discussed with: CRNA and Anesthesiologist  Anesthesia Plan Comments:        Anesthesia Quick Evaluation

## 2020-04-10 NOTE — H&P (View-Only) (Signed)
History and Physical    Lisa Parsons MPN:[id number] DOB: 02/03/1946 DOA: 04/10/2020  PCP: Pcp, No  Patient coming from: Home  I have personally briefly reviewed patient's old medical records in [facility]  Chief Complaint: Severe left flank pain HPI: Lisa Parsons is a 74 y.o. female with medical history significant of hypertension, obesity, endometriosis status post hysterectomy presents to emergency department with severe left flank pain started last night.    Patient is from [facility] who presented to emergency department with generalized abdominal pain more on the left lower quadrant after she had dinner last night at 9:00 pm.  Reports that her pain is so severe that she could not sleep last night associated with nausea and had multiple of the sorts of nonbloody vomiting this morning therefore she came to ER for further evaluation and management.  She came from [place] to visit her daughter and was plan to return to [place] this morning.    Denies headache, blurry vision, chest pain, shortness of breath, palpitation, leg swelling, fever, chills, urinary symptoms such as dysuria, hematuria, lower back pain, melena or hematochezia.  No history of smoking, alcohol, illicit drug use.  ED Course: Upon arrival to ED: Patient's blood pressure in 150s over 70s, heart rate in the 60s, afebrile, maintaining oxygen saturation on room air, CBC shows leukocytosis of 11.1, lactic acid, lipase: WNL, CMP shows mild AKI.  UA positive for RBC more than 50.  COVID-19, urine culture: Pending.  CT abdomen/pelvis was ordered which shows large calculus at left UPJ J measures 11 x 6 mm and is associated with moderate hydronephrosis and mild perinephric stranding and edema.  No acute diverticulitis.  Patient was given IV fluid bolus, Zofran, Phenergan for nausea and vomiting, fentanyl and Dilaudid for pain control.  She was also given lisinopril and amlodipine due to elevated blood  pressure.  Urology was consulted who will pain to take patient to the OR today for cystoscopy and left stent placement.  Patient was given IV Levaquin in ED.  [facility] hospitalist consulted for admission for further management.  Review of Systems: As per HPI otherwise negative.    Past Medical History:  Diagnosis Date  . HTN (hypertension)     Past Surgical History:  Procedure Laterality Date  . hystrectomy       reports that she has never smoked. She has never used smokeless tobacco. She reports current alcohol use. She reports that she does not use drugs.  Allergies  Allergen Reactions  . Contrast Media [Iodinated Diagnostic Agents]     unknown  . Iodine   . Penicillins   . Shellfish Allergy     Family History  Problem Relation Age of Onset  . Hypertension Mother     Prior to Admission medications   Medication Sig Start Date End Date Taking? Authorizing Provider  amLODipine (NORVASC) 5 MG tablet Take 5 mg by mouth daily.  05/29/15  Yes [provider]  aspirin-acetaminophen-caffeine (EXCEDRIN MIGRAINE) [phone] MG tablet Take 1 tablet by mouth every 6 (six) hours as needed for headache.   Yes [provider]  lisinopril (PRINIVIL,ZESTRIL) 20 MG tablet Take 40 mg by mouth daily.  04/18/15  Yes [provider]  olopatadine (PATADAY) 0.1 % ophthalmic solution Place 1 drop into both eyes 2 (two) times daily as needed for allergies.   Yes [provider]    Physical Exam: Vitals:   04/10/20 0933 04/10/20 0945 04/10/20 1045 04/10/20 1100  BP:  Marland Kitchen)  147/68 134/70 124/70  Pulse: (!) 57 62 (!) 57 (!) 57  Resp: 18 (!) 21 14 12   Temp:      TempSrc:      SpO2:      Weight:      Height:        Constitutional: NAD, calm, comfortable, on room air, communicating well Eyes: PERRL, lids and conjunctivae normal ENMT: Mucous membranes are moist. Posterior pharynx clear of any exudate or lesions.Normal dentition.  Neck: normal, supple, no masses,  no thyromegaly Respiratory: clear to auscultation bilaterally, no wheezing, no crackles. Normal respiratory effort. No accessory muscle use.  Cardiovascular: Regular rate and rhythm, no murmurs / rubs / gallops. No extremity edema. 2+ pedal pulses. No carotid bruits.  Abdomen: Left flank tenderness, no masses palpated. No hepatosplenomegaly. Bowel sounds positive.  Musculoskeletal: no clubbing / cyanosis. No joint deformity upper and lower extremities. Good ROM, no contractures. Normal muscle tone.  Skin: no rashes, lesions, ulcers. No induration Neurologic: CN 2-12 grossly intact. Sensation intact, DTR normal. Strength 5/5 in all 4.  Psychiatric: Normal judgment and insight. Alert and oriented x 3. Normal mood.    Labs on Admission: I have personally reviewed following labs and imaging studies  CBC: Recent Labs  Lab 04/10/20 0817  WBC 11.1*  NEUTROABS 8.9*  HGB 14.4  HCT 43.3  MCV 97.3  PLT 228   Basic Metabolic Panel: Recent Labs  Lab 04/10/20 0817  NA 137  K 3.8  CL 103  CO2 24  GLUCOSE 157*  BUN 16  CREATININE 1.10*  CALCIUM 9.5   GFR: Estimated Creatinine Clearance: 46.3 mL/min (A) (by C-G formula based on SCr of 1.1 mg/dL (H)). Liver Function Tests: Recent Labs  Lab 04/10/20 0817  AST 30  ALT 31  ALKPHOS 77  BILITOT 0.4  PROT 6.9  ALBUMIN 3.8   Recent Labs  Lab 04/10/20 0817  LIPASE 34   No results for input(s): AMMONIA in the last 168 hours. Coagulation Profile: No results for input(s): INR, PROTIME in the last 168 hours. Cardiac Enzymes: No results for input(s): CKTOTAL, CKMB, CKMBINDEX, TROPONINI in the last 168 hours. BNP (last 3 results) No results for input(s): PROBNP in the last 8760 hours. HbA1C: No results for input(s): HGBA1C in the last 72 hours. CBG: No results for input(s): GLUCAP in the last 168 hours. Lipid Profile: No results for input(s): CHOL, HDL, LDLCALC, TRIG, CHOLHDL, LDLDIRECT in the last 72 hours. Thyroid Function  Tests: No results for input(s): TSH, T4TOTAL, FREET4, T3FREE, THYROIDAB in the last 72 hours. Anemia Panel: No results for input(s): VITAMINB12, FOLATE, FERRITIN, TIBC, IRON, RETICCTPCT in the last 72 hours. Urine analysis:    Component Value Date/Time   COLORURINE STRAW (A) 04/10/2020 0817   APPEARANCEUR CLEAR 04/10/2020 0817   LABSPEC 1.011 04/10/2020 0817   PHURINE 6.0 04/10/2020 0817   GLUCOSEU 50 (A) 04/10/2020 0817   HGBUR MODERATE (A) 04/10/2020 0817   BILIRUBINUR NEGATIVE 04/10/2020 0817   KETONESUR NEGATIVE 04/10/2020 0817   PROTEINUR NEGATIVE 04/10/2020 0817   NITRITE NEGATIVE 04/10/2020 0817   LEUKOCYTESUR NEGATIVE 04/10/2020 0817    Radiological Exams on Admission: CT ABDOMEN PELVIS WO CONTRAST  Result Date: 04/10/2020 CLINICAL DATA:  74 year old female with suspected diverticulitis, acute onset of abdominal pain most notably in the LEFT lower quadrant EXAM: CT ABDOMEN AND PELVIS WITHOUT CONTRAST TECHNIQUE: Multidetector CT imaging of the abdomen and pelvis was performed following the standard protocol without IV contrast. COMPARISON:  None FINDINGS: Lower chest:  Minimal basilar atelectasis. No consolidation. No pleural effusion. Atherosclerotic plaque in the thoracic aorta. Hepatobiliary: Liver is unremarkable on noncontrast imaging with normal contour and size. No pericholecystic stranding. No gross biliary duct dilation. Pancreas: Pancreas normal without ductal dilation or sign of inflammation. Spleen: Spleen normal in size and contour. Adrenals/Urinary Tract: Adrenal glands are normal. RIGHT kidney is normal without signs of nephrolithiasis or hydronephrosis. Large calculus at the LEFT UPJ measures 11 x 6 mm and is associated with moderate hydronephrosis and marked perinephric stranding and edema. No additional renal or ureteral calculi on the LEFT. Small LEFT posterolateral urinary bladder diverticulum. Stomach/Bowel: Small hiatal hernia. Stomach under distended limiting  assessment. No small bowel dilation or acute small bowel process. Normal appendix.  Scattered colonic diverticulosis. Vascular/Lymphatic: Calcified atheromatous plaque of the abdominal aorta without aneurysmal dilation There is no gastrohepatic or hepatoduodenal ligament lymphadenopathy. No retroperitoneal or mesenteric lymphadenopathy. No pelvic sidewall lymphadenopathy. Reproductive: Post hysterectomy without adnexal mass. Other: No ascites.  No free air. Musculoskeletal: Spinal degenerative changes. No acute or destructive bone process. IMPRESSION: 1. Large calculus at the LEFT UPJ measures 11 x 6 mm and is associated with moderate hydronephrosis and marked perinephric stranding and edema. 2. Scattered colonic diverticulosis without evidence of acute diverticulitis. 3. Aortic atherosclerosis. Aortic Atherosclerosis (ICD10-I70.0). Electronically Signed   By: Donzetta Kohut M.D.   On: 04/10/2020 09:32     Assessment/Plan Principal Problem:   Obstruction of left ureteropelvic junction (UPJ) due to stone Active Problems:   HTN (hypertension)   AKI (acute kidney injury) (HCC)   Obstruction of ureteropelvic junction (UPJ) due to stone    Obstruction of left UPJ due to stone: -Patient presented with severe left flank pain, nausea and vomiting started last night.  She is afebrile and has leukocytosis of 11.1.  She does not meet sepsis criteria.  Lactic acid: WNL, lipase: WNL. -Urine culture, COVID-19 pending.  UA positive for RBC more than 50. -Patient was given IV fluids, antiemetics, pain medications and Levaquin in ED. -Reviewed CT abdomen/pelvis result. -Urology consulted who will plan to take patient to the OR later today for cystoscopy and left stent placement. -Continue IV fluids, Zofran as needed for nausea and vomiting, Dilaudid as needed for pain control.  Start patient on Rocephin. -We will keep her n.p.o. -Monitor vitals closely.  Hypertension: Blood pressure elevated upon  arrival. -Patient was given amlodipine and lisinopril in ED.  Hold p.o. home meds for now as patient is n.p.o. Will start patient on hydralazine as needed for blood pressure more than 160/100.  Resume home p.o. meds once patient is back from surgery.  AKI: -Continue IV fluids.  Hold nephrotoxic medication.  Repeat BMP tomorrow a.m.  Endometriosis status post hysterectomy  DVT prophylaxis: SCD Code Status: Full CODE  Family Communication: Patient's daughter present at bedside.  Plan of care discussed with patient in length and he verbalized understanding and agreed with it. Disposition Plan: Home in 1 to 2 days Consults called: Neurology by EDP Admission status: Inpatient   Ollen Bowl MD [facility]  If 7PM-7AM, please contact night-coverage [email or web address]  04/10/2020, 11:14 AM

## 2020-04-10 NOTE — ED Provider Notes (Signed)
Ultrasound ED Abd  Date/Time: 04/10/2020 8:37 AM Performed by: Terald Sleeper, MD Authorized by: Terald Sleeper, MD   Procedure details:    Indications: abdominal pain     Assessment for:  AAA   Aorta:  Visualized   Images: archived   Study Limitations: body habitus and bowel gas Vascular findings:    Aorta: aorta normal (< 3cm)     Aorta diameter:  2.2   Intra-abdominal fluid: unidentified        Terald Sleeper, MD 04/10/20 [phone]

## 2020-04-10 NOTE — Transfer of Care (Signed)
Immediate Anesthesia Transfer of Care Note  Patient: Lisa Parsons  Procedure(s) Performed: CYSTOSCOPY WITH RETROGRADE PYELOGRAM/URETERAL STENT PLACEMENT LEFT (Left )  Patient Location: PACU  Anesthesia Type:MAC  Level of Consciousness: awake, alert , oriented and patient cooperative  Airway & Oxygen Therapy: Patient Spontanous Breathing and Patient connected to nasal cannula oxygen  Post-op Assessment: Report given to RN and Post -op Vital signs reviewed and stable  Post vital signs: Reviewed and stable  Last Vitals:  Vitals Value Taken Time  BP    Temp    Pulse 64 04/10/20 1924  Resp 19 04/10/20 1924  SpO2 96 % 04/10/20 1924  Vitals shown include unvalidated device data.  Last Pain:  Vitals:   04/10/20 1342  TempSrc:   PainSc: 6          Complications: No complications documented.

## 2020-04-10 NOTE — Progress Notes (Signed)
Full consult to follow.  Pt with intractable left flank pain. CT with L UPJ stone up to 66mm. No sign of infection. Will plan for cysto, L stent placement later today. Posted to OR.   Matt R. Juliya Magill MD [facility]  Pager: [phone]

## 2020-04-11 ENCOUNTER — Encounter (HOSPITAL_COMMUNITY): Payer: Self-pay | Admitting: Urology

## 2020-04-11 DIAGNOSIS — N201 Calculus of ureter: Secondary | ICD-10-CM | POA: Diagnosis not present

## 2020-04-11 LAB — CBC
HCT: 38.5 % (ref 36.0–46.0)
Hemoglobin: 12.9 g/dL (ref 12.0–15.0)
MCH: 32.4 pg (ref 26.0–34.0)
MCHC: 33.5 g/dL (ref 30.0–36.0)
MCV: 96.7 fL (ref 80.0–100.0)
Platelets: 194 10*3/uL (ref 150–400)
RBC: 3.98 MIL/uL (ref 3.87–5.11)
RDW: 12.8 % (ref 11.5–15.5)
WBC: 7 10*3/uL (ref 4.0–10.5)
nRBC: 0 % (ref 0.0–0.2)

## 2020-04-11 LAB — COMPREHENSIVE METABOLIC PANEL
ALT: 23 U/L (ref 0–44)
AST: 23 U/L (ref 15–41)
Albumin: 3 g/dL — ABNORMAL LOW (ref 3.5–5.0)
Alkaline Phosphatase: 65 U/L (ref 38–126)
Anion gap: 8 (ref 5–15)
BUN: 11 mg/dL (ref 8–23)
CO2: 23 mmol/L (ref 22–32)
Calcium: 9.2 mg/dL (ref 8.9–10.3)
Chloride: 109 mmol/L (ref 98–111)
Creatinine, Ser: 1.09 mg/dL — ABNORMAL HIGH (ref 0.44–1.00)
GFR, Estimated: 53 mL/min — ABNORMAL LOW (ref >60)
Glucose, Bld: 295 mg/dL — ABNORMAL HIGH (ref 70–99)
Potassium: 4.2 mmol/L (ref 3.5–5.1)
Sodium: 140 mmol/L (ref 135–145)
Total Bilirubin: 0.5 mg/dL (ref 0.3–1.2)
Total Protein: 5.9 g/dL — ABNORMAL LOW (ref 6.5–8.1)

## 2020-04-11 LAB — URINE CULTURE

## 2020-04-11 MED ORDER — CETAPHIL MOISTURIZING EX LOTN
TOPICAL_LOTION | CUTANEOUS | Status: DC | PRN
  Filled 2020-04-11: qty 473

## 2020-04-11 MED ORDER — TAMSULOSIN HCL 0.4 MG PO CAPS: 0.4000 mg | ORAL_CAPSULE | Freq: Every day | ORAL | 2 refills | Status: DC

## 2020-04-11 MED ORDER — TAMSULOSIN HCL 0.4 MG PO CAPS
0.4000 mg | ORAL_CAPSULE | Freq: Every day | ORAL | Status: DC
  Administered 2020-04-11: 0.4 mg via ORAL
  Filled 2020-04-11: qty 1

## 2020-04-11 MED ORDER — SODIUM CHLORIDE 0.9 % IV SOLN
2.0000 g | INTRAVENOUS | Status: DC
  Administered 2020-04-11 – 2020-04-12 (×2): 2 g via INTRAVENOUS
  Filled 2020-04-11 (×3): qty 20

## 2020-04-11 MED ORDER — SODIUM CHLORIDE 0.9 % IV SOLN: INTRAVENOUS | Status: AC

## 2020-04-11 NOTE — Progress Notes (Signed)
My surgery scheduler discussed with patient today--we have no ability for ESWL this upcoming Monday. Her urine cx 10/21 multiple species present. Asked lab to speciate most predominant organism. Will need negative urine culture prior to treatment. Continue abx. Can f/u outpatient for treatment or receive treatment in [place].  Matt R. Tarence Searcy MD [facility]  Pager: [phone]

## 2020-04-11 NOTE — Progress Notes (Signed)
Progress Note    Lisa Parsons  LTJ:[id number] DOB: 03-Mar-1946  DOA: 04/10/2020 PCP: Pcp, No    Brief Narrative:     Medical records reviewed and are as summarized below:  Lisa Parsons is an 74 y.o. female with medical history significant of hypertension, obesity, endometriosis status post hysterectomy presents to emergency department with severe left flank pain started last night.    Patient is from [facility] who presented to emergency department with generalized abdominal pain more on the left lower quadrant after she had dinner last night at 9:00 pm.  Reports that her pain is so severe that she could not sleep last night associated with nausea and had multiple of the sorts of nonbloody vomiting this morning therefore she came to ER for further evaluation and management.  Assessment/Plan:   Principal Problem:   Obstruction of left ureteropelvic junction (UPJ) due to stone Active Problems:   HTN (hypertension)   AKI (acute kidney injury) (HCC)   Obstruction of ureteropelvic junction (UPJ) due to stone   Obstruction of left UPJ due to stone: -Patient presented with severe left flank pain, nausea and vomiting started last night.  She is afebrile and has leukocytosis of 11.1.   -Urine culture pending x 2 - UA positive for RBC more than 50. -Urology consulted : s/p obstructing left UPJ stone. Discussed definitive management of stone. Pt lives in [place]; however, is amenable to have ESWL done here if this can be achieved soon. She does not want to undergo a general anesthetic. -rocephin until cultures back  Hypertension: Blood pressure elevated upon arrival. -resume home meds  AKI: -Continue IV fluids    Family Communication/Anticipated D/C date and plan/Code Status   DVT prophylaxis: Lovenox ordered. Code Status: Full Code.  Family Communication: daughter at bedside Disposition Plan: Status is: Inpatient  Remains inpatient appropriate  because:Inpatient level of care appropriate due to severity of illness   Dispo: The patient is from: Home              Anticipated d/c is to: Home              Anticipated d/c date is: 1 day              Patient currently is not medically stable to d/c.- await culture         Medical Consultants:    urology  Subjective:   Still with occasional pains, more so with movement and urination  Objective:    Vitals:   04/10/20 1951 04/11/20 0034 04/11/20 0433 04/11/20 1135  BP: 119/60 135/67 137/61 133/70  Pulse: 60 68 64 62  Resp: 19 16 17 19   Temp: (!) 97.5 F (36.4 C) 98.3 F (36.8 C) 97.8 F (36.6 C) 98.4 F (36.9 C)  TempSrc:  Oral Oral Oral  SpO2: 96% 96% 98% 96%  Weight:      Height:        Intake/Output Summary (Last 24 hours) at 04/11/2020 1402 Last data filed at 04/11/2020 0500 Gross per 24 hour  Intake 1426.25 ml  Output 0 ml  Net 1426.25 ml   Filed Weights   04/10/20 0708  Weight: 79.4 kg    Exam:  General: Appearance:     Overweight female in no acute distress     Lungs:     respirations unlabored  Heart:    Normal heart rate. Normal rhythm. No murmurs, rubs, or gallops.   MS:   All extremities are intact.  Neurologic:   Awake, alert, oriented x 3. No apparent focal neurological           defect.     Data Reviewed:   I have personally reviewed following labs and imaging studies:  Labs: Labs show the following:   Basic Metabolic Panel: Recent Labs  Lab 04/10/20 0817 04/10/20 1049 04/11/20 0353  NA 137  --  140  K 3.8  --  4.2  CL 103  --  109  CO2 24  --  23  GLUCOSE 157*  --  295*  BUN 16  --  11  CREATININE 1.10*  --  1.09*  CALCIUM 9.5  --  9.2  MG  --  2.0  --    GFR Estimated Creatinine Clearance: 46.7 mL/min (A) (by C-G formula based on SCr of 1.09 mg/dL (H)). Liver Function Tests: Recent Labs  Lab 04/10/20 0817 04/11/20 0353  AST 30 23  ALT 31 23  ALKPHOS 77 65  BILITOT 0.4 0.5  PROT 6.9 5.9*  ALBUMIN 3.8  3.0*   Recent Labs  Lab 04/10/20 0817  LIPASE 34   No results for input(s): AMMONIA in the last 168 hours. Coagulation profile No results for input(s): INR, PROTIME in the last 168 hours.  CBC: Recent Labs  Lab 04/10/20 0817 04/11/20 0353  WBC 11.1* 7.0  NEUTROABS 8.9*  --   HGB 14.4 12.9  HCT 43.3 38.5  MCV 97.3 96.7  PLT 228 194   Cardiac Enzymes: No results for input(s): CKTOTAL, CKMB, CKMBINDEX, TROPONINI in the last 168 hours. BNP (last 3 results) No results for input(s): PROBNP in the last 8760 hours. CBG: No results for input(s): GLUCAP in the last 168 hours. D-Dimer: No results for input(s): DDIMER in the last 72 hours. Hgb A1c: No results for input(s): HGBA1C in the last 72 hours. Lipid Profile: No results for input(s): CHOL, HDL, LDLCALC, TRIG, CHOLHDL, LDLDIRECT in the last 72 hours. Thyroid function studies: No results for input(s): TSH, T4TOTAL, T3FREE, THYROIDAB in the last 72 hours.  Invalid input(s): FREET3 Anemia work up: No results for input(s): VITAMINB12, FOLATE, FERRITIN, TIBC, IRON, RETICCTPCT in the last 72 hours. Sepsis Labs: Recent Labs  Lab 04/10/20 0817 04/11/20 0353  WBC 11.1* 7.0  LATICACIDVEN 1.9  --     Microbiology Recent Results (from the past 240 hour(s))  Urine culture     Status: Abnormal   Collection Time: 04/10/20  8:17 AM   Specimen: Urine, Random  Result Value Ref Range Status   Specimen Description URINE, RANDOM  Final   Special Requests   Final    NONE Performed at [facility], [address] [place], [place] [address]    Culture MULTIPLE SPECIES PRESENT, SUGGEST RECOLLECTION (A)  Final   Report Status 04/11/2020 FINAL  Final  Respiratory Panel by RT PCR (Flu A&B, Covid) - Nasopharyngeal Swab     Status: None   Collection Time: 04/10/20 10:02 AM   Specimen: Nasopharyngeal Swab  Result Value Ref Range Status   SARS Coronavirus 2 by RT PCR NEGATIVE NEGATIVE Final    Comment: (NOTE) SARS-CoV-2 target  nucleic acids are NOT DETECTED.  The SARS-CoV-2 RNA is generally detectable in upper respiratoy specimens during the acute phase of infection. The lowest concentration of SARS-CoV-2 viral copies this assay can detect is 131 copies/mL. A negative result does not preclude SARS-Cov-2 infection and should not be used as the sole basis for treatment or other patient management decisions. A  negative result may occur with  improper specimen collection/handling, submission of specimen other than nasopharyngeal swab, presence of viral mutation(s) within the areas targeted by this assay, and inadequate number of viral copies (<131 copies/mL). A negative result must be combined with clinical observations, patient history, and epidemiological information. The expected result is Negative.  Fact Sheet for Patients:  https://www.moore.com/  Fact Sheet for Healthcare Providers:  https://www.young.biz/  This test is no t yet approved or cleared by the [place] FDA and  has been authorized for detection and/or diagnosis of SARS-CoV-2 by FDA under an Emergency Use Authorization (EUA). This EUA will remain  in effect (meaning this test can be used) for the duration of the COVID-19 declaration under Section 564(b)(1) of the Act, 21 U.S.C. section 360bbb-3(b)(1), unless the authorization is terminated or revoked sooner.     Influenza A by PCR NEGATIVE NEGATIVE Final   Influenza B by PCR NEGATIVE NEGATIVE Final    Comment: (NOTE) The Xpert Xpress SARS-CoV-2/FLU/RSV assay is intended as an aid in  the diagnosis of influenza from Nasopharyngeal swab specimens and  should not be used as a sole basis for treatment. Nasal washings and  aspirates are unacceptable for Xpert Xpress SARS-CoV-2/FLU/RSV  testing.  Fact Sheet for Patients: https://www.moore.com/  Fact Sheet for Healthcare  Providers: https://www.young.biz/  This test is not yet approved or cleared by the [place] FDA and  has been authorized for detection and/or diagnosis of SARS-CoV-2 by  FDA under an Emergency Use Authorization (EUA). This EUA will remain  in effect (meaning this test can be used) for the duration of the  Covid-19 declaration under Section 564(b)(1) of the Act, 21  U.S.C. section 360bbb-3(b)(1), unless the authorization is  terminated or revoked. Performed at [facility], [address], [place], [place] [address]   Anaerobic culture     Status: None (Preliminary result)   Collection Time: 04/10/20  6:58 PM   Specimen: Urine, Random  Result Value Ref Range Status   Specimen Description URINE, RANDOM EFT RENAL PELVIS  Final   Special Requests NONE  Final   Gram Stain   Final    NO WBC SEEN NO ORGANISMS SEEN Performed at [facility], [address] [place], [place] [address]    Culture PENDING  Incomplete   Report Status PENDING  Incomplete    Procedures and diagnostic studies:  CT ABDOMEN PELVIS WO CONTRAST  Result Date: 04/10/2020 CLINICAL DATA:  74 year old female with suspected diverticulitis, acute onset of abdominal pain most notably in the LEFT lower quadrant EXAM: CT ABDOMEN AND PELVIS WITHOUT CONTRAST TECHNIQUE: Multidetector CT imaging of the abdomen and pelvis was performed following the standard protocol without IV contrast. COMPARISON:  None FINDINGS: Lower chest: Minimal basilar atelectasis. No consolidation. No pleural effusion. Atherosclerotic plaque in the thoracic aorta. Hepatobiliary: Liver is unremarkable on noncontrast imaging with normal contour and size. No pericholecystic stranding. No gross biliary duct dilation. Pancreas: Pancreas normal without ductal dilation or sign of inflammation. Spleen: Spleen normal in size and contour. Adrenals/Urinary Tract: Adrenal glands are normal. RIGHT kidney is normal without signs of  nephrolithiasis or hydronephrosis. Large calculus at the LEFT UPJ measures 11 x 6 mm and is associated with moderate hydronephrosis and marked perinephric stranding and edema. No additional renal or ureteral calculi on the LEFT. Small LEFT posterolateral urinary bladder diverticulum. Stomach/Bowel: Small hiatal hernia. Stomach under distended limiting assessment. No small bowel dilation or acute small bowel process. Normal appendix.  Scattered  colonic diverticulosis. Vascular/Lymphatic: Calcified atheromatous plaque of the abdominal aorta without aneurysmal dilation There is no gastrohepatic or hepatoduodenal ligament lymphadenopathy. No retroperitoneal or mesenteric lymphadenopathy. No pelvic sidewall lymphadenopathy. Reproductive: Post hysterectomy without adnexal mass. Other: No ascites.  No free air. Musculoskeletal: Spinal degenerative changes. No acute or destructive bone process. IMPRESSION: 1. Large calculus at the LEFT UPJ measures 11 x 6 mm and is associated with moderate hydronephrosis and marked perinephric stranding and edema. 2. Scattered colonic diverticulosis without evidence of acute diverticulitis. 3. Aortic atherosclerosis. Aortic Atherosclerosis (ICD10-I70.0). Electronically Signed   By: Donzetta Kohut M.D.   On: 04/10/2020 09:32   DG Retrograde Pyelogram  Result Date: 04/11/2020 CLINICAL DATA:  Left-sided retrograde pyelogram EXAM: RETROGRADE PYELOGRAM COMPARISON:  CT abdomen pelvis-earlier same day FLUOROSCOPY TIME:  30 seconds (6.2 mGy) FINDINGS: Two spot intraoperative fluoroscopic images of the left upper abdominal quadrant are provided for review. Provided images demonstrate selective cannulation and opacification of the left ureter. A nonocclusive filling defect is seen within the left renal pelvis compatible with known pelvic stone. Minimal amount of pelvicaliectasis, similar to preceding abdominal CT. IMPRESSION: Left-sided retrograde pyelogram demonstrates a suspected left pelvic  stone and mild left-sided pelvicaliectasis, similar to preceding abdominal CT. Electronically Signed   By: Simonne Come M.D.   On: 04/11/2020 07:41    Medications:   . amLODipine  5 mg Oral Daily  . lisinopril  40 mg Oral Daily  . tamsulosin  0.4 mg Oral QPC supper   Continuous Infusions: . sodium chloride    . cefTRIAXone (ROCEPHIN)  IV 2 g (04/11/20 1321)     LOS: 1 day   Joseph Art  [facility]   How to contact the [facility] Attending or Consulting provider 7A - 7P or covering provider during after hours 7P -7A, for this patient?  1. Check the care team in [facility] and look for a) attending/consulting [facility] provider listed and b) the [facility] team listed 2. Log into [email or web address] and use [place]'s universal password to access. If you do not have the password, please contact the hospital operator. 3. Locate the [facility] provider you are looking for under [facility] and page to a number that you can be directly reached. 4. If you still have difficulty reaching the provider, please page the [facility] (Director on Call) for the Hospitalists listed on amion for assistance.  04/11/2020, 2:02 PM

## 2020-04-11 NOTE — Progress Notes (Addendum)
1 Day Post-Op Subjective: C/o left abdominal pain/stent discomfort, much improved from acute pain. No nausea or emesis. No fevers overnight.  Objective: Vital signs in last 24 hours: Temp:  [97.4 F (36.3 C)-98.3 F (36.8 C)] 97.8 F (36.6 C) (10/22 0433) Pulse Rate:  [56-68] 64 (10/22 0433) Resp:  [12-21] 17 (10/22 0433) BP: (98-147)/(55-70) 137/61 (10/22 0433) SpO2:  [95 %-98 %] 98 % (10/22 0433)  Intake/Output from previous day: 10/21 0701 - 10/22 0700 In: 1426.3 [I.V.:1326.3; IV Piggyback:100] Out: 0  Intake/Output this shift: No intake/output data recorded.  Physical Exam:  General: Alert and oriented CV: RRR Lungs: Clear Abdomen: Soft, ND, ATTP Ext: NT, No erythema  Lab Results: Recent Labs    04/10/20 0817 04/11/20 0353  HGB 14.4 12.9  HCT 43.3 38.5   BMET Recent Labs    04/10/20 0817 04/11/20 0353  NA 137 140  K 3.8 4.2  CL 103 109  CO2 24 23  GLUCOSE 157* 295*  BUN 16 11  CREATININE 1.10* 1.09*  CALCIUM 9.5 9.2     Studies/Results: CT ABDOMEN PELVIS WO CONTRAST  Result Date: 04/10/2020 CLINICAL DATA:  74 year old female with suspected diverticulitis, acute onset of abdominal pain most notably in the LEFT lower quadrant EXAM: CT ABDOMEN AND PELVIS WITHOUT CONTRAST TECHNIQUE: Multidetector CT imaging of the abdomen and pelvis was performed following the standard protocol without IV contrast. COMPARISON:  None FINDINGS: Lower chest: Minimal basilar atelectasis. No consolidation. No pleural effusion. Atherosclerotic plaque in the thoracic aorta. Hepatobiliary: Liver is unremarkable on noncontrast imaging with normal contour and size. No pericholecystic stranding. No gross biliary duct dilation. Pancreas: Pancreas normal without ductal dilation or sign of inflammation. Spleen: Spleen normal in size and contour. Adrenals/Urinary Tract: Adrenal glands are normal. RIGHT kidney is normal without signs of nephrolithiasis or hydronephrosis. Large calculus at the  LEFT UPJ measures 11 x 6 mm and is associated with moderate hydronephrosis and marked perinephric stranding and edema. No additional renal or ureteral calculi on the LEFT. Small LEFT posterolateral urinary bladder diverticulum. Stomach/Bowel: Small hiatal hernia. Stomach under distended limiting assessment. No small bowel dilation or acute small bowel process. Normal appendix.  Scattered colonic diverticulosis. Vascular/Lymphatic: Calcified atheromatous plaque of the abdominal aorta without aneurysmal dilation There is no gastrohepatic or hepatoduodenal ligament lymphadenopathy. No retroperitoneal or mesenteric lymphadenopathy. No pelvic sidewall lymphadenopathy. Reproductive: Post hysterectomy without adnexal mass. Other: No ascites.  No free air. Musculoskeletal: Spinal degenerative changes. No acute or destructive bone process. IMPRESSION: 1. Large calculus at the LEFT UPJ measures 11 x 6 mm and is associated with moderate hydronephrosis and marked perinephric stranding and edema. 2. Scattered colonic diverticulosis without evidence of acute diverticulitis. 3. Aortic atherosclerosis. Aortic Atherosclerosis (ICD10-I70.0). Electronically Signed   By: Donzetta Kohut M.D.   On: 04/10/2020 09:32   DG Retrograde Pyelogram  Result Date: 04/11/2020 CLINICAL DATA:  Left-sided retrograde pyelogram EXAM: RETROGRADE PYELOGRAM COMPARISON:  CT abdomen pelvis-earlier same day FLUOROSCOPY TIME:  30 seconds (6.2 mGy) FINDINGS: Two spot intraoperative fluoroscopic images of the left upper abdominal quadrant are provided for review. Provided images demonstrate selective cannulation and opacification of the left ureter. A nonocclusive filling defect is seen within the left renal pelvis compatible with known pelvic stone. Minimal amount of pelvicaliectasis, similar to preceding abdominal CT. IMPRESSION: Left-sided retrograde pyelogram demonstrates a suspected left pelvic stone and mild left-sided pelvicaliectasis, similar to  preceding abdominal CT. Electronically Signed   By: Simonne Come M.D.   On: 04/11/2020 07:41  Assessment/Plan: 1. Obstructing left UPJ stone: CT A/P 04/10/2020 revealed a 11 x 6 mm stone at the left UPJ associate with moderate hydronephrosis.  No sign of infection.  Creatinine 1.1 from unknown baseline.  Poorly controlled pain. S/p cysto, L RPG, L stent placement 04/11/2020  -Pain control prn -Added flomax for stent discomfort -F/u urine cx. Treat if positive. -Discussed definitive management of stone. Pt lives in [place]; however, is amenable to have ESWL done here if this can be achieved soon. She does not want to undergo a general anesthetic. -I was able to see the stone on fluoro. -Messaged surgery schedulers to see if it is feasible to treat the stone with ESWL next week assuming Ucx results negative -Appropriate to discharge home once on culture specific antibiotics   LOS: 1 day   Jannifer Hick 04/11/2020, 8:09 AM Matt R. Derya Dettmann MD [facility]  Pager: [phone]

## 2020-04-12 DIAGNOSIS — N201 Calculus of ureter: Secondary | ICD-10-CM | POA: Diagnosis not present

## 2020-04-12 LAB — BASIC METABOLIC PANEL
Anion gap: 7 (ref 5–15)
BUN: 16 mg/dL (ref 8–23)
CO2: 25 mmol/L (ref 22–32)
Calcium: 9 mg/dL (ref 8.9–10.3)
Chloride: 107 mmol/L (ref 98–111)
Creatinine, Ser: 0.85 mg/dL (ref 0.44–1.00)
GFR, Estimated: >60 mL/min (ref >60)
Glucose, Bld: 106 mg/dL — ABNORMAL HIGH (ref 70–99)
Potassium: 3.8 mmol/L (ref 3.5–5.1)
Sodium: 139 mmol/L (ref 135–145)

## 2020-04-12 LAB — CBC
HCT: 37.7 % (ref 36.0–46.0)
Hemoglobin: 12.5 g/dL (ref 12.0–15.0)
MCH: 32.2 pg (ref 26.0–34.0)
MCHC: 33.2 g/dL (ref 30.0–36.0)
MCV: 97.2 fL (ref 80.0–100.0)
Platelets: 187 10*3/uL (ref 150–400)
RBC: 3.88 MIL/uL (ref 3.87–5.11)
RDW: 13.1 % (ref 11.5–15.5)
WBC: 10 10*3/uL (ref 4.0–10.5)
nRBC: 0 % (ref 0.0–0.2)

## 2020-04-12 LAB — URINE CULTURE: Culture: NO GROWTH

## 2020-04-12 MED ORDER — TAMSULOSIN HCL 0.4 MG PO CAPS
0.4000 mg | ORAL_CAPSULE | Freq: Every day | ORAL | Status: DC
  Administered 2020-04-12 – 2020-04-13 (×2): 0.4 mg via ORAL
  Filled 2020-04-12 (×2): qty 1

## 2020-04-12 MED ORDER — ALPRAZOLAM 0.25 MG PO TABS
0.2500 mg | ORAL_TABLET | Freq: Two times a day (BID) | ORAL | Status: DC | PRN
  Administered 2020-04-12: 0.25 mg via ORAL
  Filled 2020-04-12: qty 1

## 2020-04-12 MED ORDER — TRAMADOL HCL 50 MG PO TABS
50.0000 mg | ORAL_TABLET | Freq: Four times a day (QID) | ORAL | Status: DC | PRN
  Filled 2020-04-12: qty 1

## 2020-04-12 MED ORDER — OXYCODONE HCL 5 MG PO TABS: 2.5000 mg | ORAL_TABLET | ORAL | Status: DC | PRN

## 2020-04-12 MED ORDER — DOCUSATE SODIUM 100 MG PO CAPS
100.0000 mg | ORAL_CAPSULE | Freq: Two times a day (BID) | ORAL | Status: DC | PRN
  Administered 2020-04-12: 100 mg via ORAL
  Filled 2020-04-12: qty 1

## 2020-04-12 NOTE — Progress Notes (Signed)
Progress Note    Lisa Parsons  YHC:[id number] DOB: 02/19/1946  DOA: 04/10/2020 PCP: Pcp, No    Brief Narrative:     Medical records reviewed and are as summarized below:  Lisa Parsons is an 74 y.o. female with medical history significant of hypertension, obesity, endometriosis status post hysterectomy presents to emergency department with severe left flank pain started last night.    Patient is from [facility] who presented to emergency department with generalized abdominal pain more on the left lower quadrant after she had dinner last night at 9:00 pm.  Reports that her pain is so severe that she could not sleep last night associated with nausea and had multiple of the sorts of nonbloody vomiting this morning therefore she came to ER for further evaluation and management.  Assessment/Plan:   Principal Problem:   Obstruction of left ureteropelvic junction (UPJ) due to stone Active Problems:   HTN (hypertension)   AKI (acute kidney injury) (HCC)   Obstruction of ureteropelvic junction (UPJ) due to stone   Obstruction of left UPJ due to stone: -Patient presented with severe left flank pain, nausea and vomiting started last night.  She is afebrile and has leukocytosis of 11.1.   -Urine culture pending x 2 and thus far no specific growth - UA positive for RBC more than 50. -Urology consulted : s/p obstructing left UPJ stone. Discussed definitive management of stone. Pt lives in [place]; however, is amenable to have ESWL done here if this can be achieved soon. She does not want to undergo a general anesthetic.  Hypertension: Blood pressure elevated upon arrival. -resume home meds  AKI: -Continue IV fluids  Anxiety -PRN xanax while in the hospital  Family Communication/Anticipated D/C date and plan/Code Status   DVT prophylaxis: Lovenox ordered. Code Status: Full Code.  Family Communication: daughter at bedside Disposition Plan: Status is:  Inpatient  Remains inpatient appropriate because:Inpatient level of care appropriate due to severity of illness   Dispo: The patient is from: Home              Anticipated d/c is to: Home              Anticipated d/c date is: 1 day              Patient currently is not medically stable to d/c. home in AM if tolerating PO and pain controlled         Medical Consultants:    urology  Subjective:   Very anxious about possibly having pain  Objective:    Vitals:   04/12/20 0107 04/12/20 0535 04/12/20 0815 04/12/20 1228  BP: (!) 171/83 (!) 168/77 (!) 147/80 125/69  Pulse: (!) 58 (!) 55 (!) 58 (!) 53  Resp: 16 16  16   Temp: 98.4 F (36.9 C) 97.6 F (36.4 C)  97.7 F (36.5 C)  TempSrc: Oral Oral  Oral  SpO2: 97% 95%  96%  Weight:      Height:        Intake/Output Summary (Last 24 hours) at 04/12/2020 1649 Last data filed at 04/12/2020 0600 Gross per 24 hour  Intake 240 ml  Output --  Net 240 ml   Filed Weights   04/10/20 0708  Weight: 79.4 kg    Exam:  General: Appearance:     Overweight female in no acute distress     Lungs:     respirations unlabored  Heart:    Bradycardic. Normal rhythm. No murmurs, rubs, or  gallops.   MS:   All extremities are intact.   Neurologic:   Awake, alert, oriented x 3. No apparent focal neurological           defect.      Data Reviewed:   I have personally reviewed following labs and imaging studies:  Labs: Labs show the following:   Basic Metabolic Panel: Recent Labs  Lab 04/10/20 0817 04/10/20 0817 04/10/20 1049 04/11/20 0353 04/12/20 0122  NA 137  --   --  140 139  K 3.8   < >  --  4.2 3.8  CL 103  --   --  109 107  CO2 24  --   --  23 25  GLUCOSE 157*  --   --  295* 106*  BUN 16  --   --  11 16  CREATININE 1.10*  --   --  1.09* 0.85  CALCIUM 9.5  --   --  9.2 9.0  MG  --   --  2.0  --   --    < > = values in this interval not displayed.   GFR Estimated Creatinine Clearance: 59.9 mL/min (by C-G  formula based on SCr of 0.85 mg/dL). Liver Function Tests: Recent Labs  Lab 04/10/20 0817 04/11/20 0353  AST 30 23  ALT 31 23  ALKPHOS 77 65  BILITOT 0.4 0.5  PROT 6.9 5.9*  ALBUMIN 3.8 3.0*   Recent Labs  Lab 04/10/20 0817  LIPASE 34   No results for input(s): AMMONIA in the last 168 hours. Coagulation profile No results for input(s): INR, PROTIME in the last 168 hours.  CBC: Recent Labs  Lab 04/10/20 0817 04/11/20 0353 04/12/20 0122  WBC 11.1* 7.0 10.0  NEUTROABS 8.9*  --   --   HGB 14.4 12.9 12.5  HCT 43.3 38.5 37.7  MCV 97.3 96.7 97.2  PLT 228 194 187   Cardiac Enzymes: No results for input(s): CKTOTAL, CKMB, CKMBINDEX, TROPONINI in the last 168 hours. BNP (last 3 results) No results for input(s): PROBNP in the last 8760 hours. CBG: No results for input(s): GLUCAP in the last 168 hours. D-Dimer: No results for input(s): DDIMER in the last 72 hours. Hgb A1c: No results for input(s): HGBA1C in the last 72 hours. Lipid Profile: No results for input(s): CHOL, HDL, LDLCALC, TRIG, CHOLHDL, LDLDIRECT in the last 72 hours. Thyroid function studies: No results for input(s): TSH, T4TOTAL, T3FREE, THYROIDAB in the last 72 hours.  Invalid input(s): FREET3 Anemia work up: No results for input(s): VITAMINB12, FOLATE, FERRITIN, TIBC, IRON, RETICCTPCT in the last 72 hours. Sepsis Labs: Recent Labs  Lab 04/10/20 0817 04/11/20 0353 04/12/20 0122  WBC 11.1* 7.0 10.0  LATICACIDVEN 1.9  --   --     Microbiology Recent Results (from the past 240 hour(s))  Urine culture     Status: Abnormal   Collection Time: 04/10/20  8:17 AM   Specimen: Urine, Random  Result Value Ref Range Status   Specimen Description URINE, RANDOM  Final   Special Requests   Final    NONE Performed at [facility], [address] [place], [place] [address]    Culture MULTIPLE SPECIES PRESENT, SUGGEST RECOLLECTION (A)  Final   Report Status 04/11/2020 FINAL  Final  Respiratory Panel  by RT PCR (Flu A&B, Covid) - Nasopharyngeal Swab     Status: None   Collection Time: 04/10/20 10:02 AM   Specimen: Nasopharyngeal Swab  Result Value Ref  Range Status   SARS Coronavirus 2 by RT PCR NEGATIVE NEGATIVE Final    Comment: (NOTE) SARS-CoV-2 target nucleic acids are NOT DETECTED.  The SARS-CoV-2 RNA is generally detectable in upper respiratoy specimens during the acute phase of infection. The lowest concentration of SARS-CoV-2 viral copies this assay can detect is 131 copies/mL. A negative result does not preclude SARS-Cov-2 infection and should not be used as the sole basis for treatment or other patient management decisions. A negative result may occur with  improper specimen collection/handling, submission of specimen other than nasopharyngeal swab, presence of viral mutation(s) within the areas targeted by this assay, and inadequate number of viral copies (<131 copies/mL). A negative result must be combined with clinical observations, patient history, and epidemiological information. The expected result is Negative.  Fact Sheet for Patients:  https://www.moore.com/https://www.fda.gov/media/142436/download  Fact Sheet for Healthcare Providers:  https://www.young.biz/https://www.fda.gov/media/142435/download  This test is no t yet approved or cleared by the [place] FDA and  has been authorized for detection and/or diagnosis of SARS-CoV-2 by FDA under an Emergency Use Authorization (EUA). This EUA will remain  in effect (meaning this test can be used) for the duration of the COVID-19 declaration under Section 564(b)(1) of the Act, 21 U.S.C. section 360bbb-3(b)(1), unless the authorization is terminated or revoked sooner.     Influenza A by PCR NEGATIVE NEGATIVE Final   Influenza B by PCR NEGATIVE NEGATIVE Final    Comment: (NOTE) The Xpert Xpress SARS-CoV-2/FLU/RSV assay is intended as an aid in  the diagnosis of influenza from Nasopharyngeal swab specimens and  should not be used as a sole basis for  treatment. Nasal washings and  aspirates are unacceptable for Xpert Xpress SARS-CoV-2/FLU/RSV  testing.  Fact Sheet for Patients: https://www.moore.com/https://www.fda.gov/media/142436/download  Fact Sheet for Healthcare Providers: https://www.young.biz/https://www.fda.gov/media/142435/download  This test is not yet approved or cleared by the [place] FDA and  has been authorized for detection and/or diagnosis of SARS-CoV-2 by  FDA under an Emergency Use Authorization (EUA). This EUA will remain  in effect (meaning this test can be used) for the duration of the  Covid-19 declaration under Section 564(b)(1) of the Act, 21  U.S.C. section 360bbb-3(b)(1), unless the authorization is  terminated or revoked. Performed at [facility], [address] [place], [place] [address]   Urine Culture     Status: None   Collection Time: 04/10/20  6:58 PM   Specimen: Urine, Random  Result Value Ref Range Status   Specimen Description URINE, RANDOM LEFT RENAL PELVIS  Final   Special Requests NONE  Final   Culture   Final    NO GROWTH Performed at [facility], [address] [place], [place] [address]    Report Status 04/12/2020 FINAL  Final  Anaerobic culture     Status: None (Preliminary result)   Collection Time: 04/10/20  6:58 PM   Specimen: Urine, Random  Result Value Ref Range Status   Specimen Description URINE, RANDOM EFT RENAL PELVIS  Final   Special Requests NONE  Final   Gram Stain   Final    NO WBC SEEN NO ORGANISMS SEEN Performed at [facility], [address] [place], [place] [address]    Culture PENDING  Incomplete   Report Status PENDING  Incomplete    Procedures and diagnostic studies:  DG Retrograde Pyelogram  Result Date: 04/11/2020 CLINICAL DATA:  Left-sided retrograde pyelogram EXAM: RETROGRADE PYELOGRAM COMPARISON:  CT abdomen pelvis-earlier same day FLUOROSCOPY TIME:  30 seconds (  6.2 mGy) FINDINGS: Two spot intraoperative fluoroscopic images of the left upper abdominal  quadrant are provided for review. Provided images demonstrate selective cannulation and opacification of the left ureter. A nonocclusive filling defect is seen within the left renal pelvis compatible with known pelvic stone. Minimal amount of pelvicaliectasis, similar to preceding abdominal CT. IMPRESSION: Left-sided retrograde pyelogram demonstrates a suspected left pelvic stone and mild left-sided pelvicaliectasis, similar to preceding abdominal CT. Electronically Signed   By: Simonne Come M.D.   On: 04/11/2020 07:41    Medications:   . amLODipine  5 mg Oral Daily  . lisinopril  40 mg Oral Daily  . tamsulosin  0.4 mg Oral Daily   Continuous Infusions: . cefTRIAXone (ROCEPHIN)  IV 2 g (04/12/20 1203)     LOS: 2 days   Joseph Art  [facility]   How to contact the [facility] Attending or Consulting provider 7A - 7P or covering provider during after hours 7P -7A, for this patient?  1. Check the care team in [facility] and look for a) attending/consulting [facility] provider listed and b) the [facility] team listed 2. Log into [email or web address] and use [facility]'s universal password to access. If you do not have the password, please contact the hospital operator. 3. Locate the [facility] provider you are looking for under [facility] and page to a number that you can be directly reached. 4. If you still have difficulty reaching the provider, please page the [facility] (Director on Call) for the Hospitalists listed on amion for assistance.  04/12/2020, 4:49 PM

## 2020-04-13 DIAGNOSIS — N201 Calculus of ureter: Secondary | ICD-10-CM | POA: Diagnosis not present

## 2020-04-13 MED ORDER — BISACODYL 10 MG RE SUPP: 10.0000 mg | Freq: Every day | RECTAL | 0 refills | Status: AC | PRN

## 2020-04-13 MED ORDER — BISACODYL 10 MG RE SUPP
10.0000 mg | Freq: Every day | RECTAL | Status: DC | PRN
  Filled 2020-04-13: qty 1

## 2020-04-13 MED ORDER — ALPRAZOLAM 0.25 MG PO TABS: 0.2500 mg | ORAL_TABLET | Freq: Two times a day (BID) | ORAL | 0 refills | Status: AC | PRN

## 2020-04-13 MED ORDER — TAMSULOSIN HCL 0.4 MG PO CAPS: 0.4000 mg | ORAL_CAPSULE | Freq: Every day | ORAL | 2 refills | Status: AC

## 2020-04-13 NOTE — Discharge Summary (Signed)
Physician Discharge Summary  Lisa Parsons DVV:[id number] DOB: 09-20-1945 DOA: 04/10/2020  PCP: Pcp, No  Admit date: 04/10/2020 Discharge date: 04/13/2020  Admitted From: home Discharge disposition: home   Recommendations for Outpatient Follow-Up:   1. Urology follow up: treat the stone with ESWL and then stent removal   Discharge Diagnosis:   Principal Problem:   Obstruction of left ureteropelvic junction (UPJ) due to stone Active Problems:   HTN (hypertension)   AKI (acute kidney injury) (HCC)   Obstruction of ureteropelvic junction (UPJ) due to stone    Discharge Condition: Improved.  Diet recommendation: Low sodium, heart healthy.   Wound care: None.  Code status: Full.   History of Present Illness:   Lisa Parsons is a 74 y.o. female with medical history significant of hypertension, obesity, endometriosis status post hysterectomy presents to emergency department with severe left flank pain started last night.    Patient is from [facility] who presented to emergency department with generalized abdominal pain more on the left lower quadrant after she had dinner last night at 9:00 pm.  Reports that her pain is so severe that she could not sleep last night associated with nausea and had multiple of the sorts of nonbloody vomiting this morning therefore she came to ER for further evaluation and management.  She came from [place] to visit her daughter and was plan to return to [place] this morning.    Denies headache, blurry vision, chest pain, shortness of breath, palpitation, leg swelling, fever, chills, urinary symptoms such as dysuria, hematuria, lower back pain, melena or hematochezia.  No history of smoking, alcohol, illicit drug use.   Hospital Course by Problem:   Obstruction of left UPJ due to stone: -Patient presented with severe left flank pain, nausea and vomiting started last night.  -Urine culture pending x 2-  multiple species and the other with no growth -Urology consulted : Messaged surgery schedulers to see if it is feasible to treat the stone with ESWL next week assuming Ucx results negative  Hypertension:  -resume home meds -suspect anxiety is driving higher BPs  AKI: resolved  Anxiety -PRN xanax while in the hospital -patient remains incredibly anxious even though I rounded on her 3x/day: multiple complaints    Medical Consultants:   urology   Discharge Exam:   Vitals:   04/13/20 0030 04/13/20 0620  BP: 138/65 (!) 148/67  Pulse: 69 (!) 53  Resp: 16 16  Temp: (!) 97.5 F (36.4 C) 98 F (36.7 C)  SpO2: 94% 97%   Vitals:   04/12/20 1228 04/12/20 1820 04/13/20 0030 04/13/20 0620  BP: 125/69 (!) 112/56 138/65 (!) 148/67  Pulse: (!) 53 (!) 56 69 (!) 53  Resp: 16 16 16 16   Temp: 97.7 F (36.5 C) 98.7 F (37.1 C) (!) 97.5 F (36.4 C) 98 F (36.7 C)  TempSrc: Oral Oral Oral Oral  SpO2: 96% 94% 94% 97%  Weight:    80.4 kg  Height:        General exam: anxious appearing   The results of significant diagnostics from this hospitalization (including imaging, microbiology, ancillary and laboratory) are listed below for reference.     Procedures and Diagnostic Studies:   CT ABDOMEN PELVIS WO CONTRAST  Result Date: 04/10/2020 CLINICAL DATA:  74 year old female with suspected diverticulitis, acute onset of abdominal pain most notably in the LEFT lower quadrant EXAM: CT ABDOMEN AND PELVIS WITHOUT CONTRAST TECHNIQUE: Multidetector CT imaging of the abdomen and pelvis  was performed following the standard protocol without IV contrast. COMPARISON:  None FINDINGS: Lower chest: Minimal basilar atelectasis. No consolidation. No pleural effusion. Atherosclerotic plaque in the thoracic aorta. Hepatobiliary: Liver is unremarkable on noncontrast imaging with normal contour and size. No pericholecystic stranding. No gross biliary duct dilation. Pancreas: Pancreas normal without ductal  dilation or sign of inflammation. Spleen: Spleen normal in size and contour. Adrenals/Urinary Tract: Adrenal glands are normal. RIGHT kidney is normal without signs of nephrolithiasis or hydronephrosis. Large calculus at the LEFT UPJ measures 11 x 6 mm and is associated with moderate hydronephrosis and marked perinephric stranding and edema. No additional renal or ureteral calculi on the LEFT. Small LEFT posterolateral urinary bladder diverticulum. Stomach/Bowel: Small hiatal hernia. Stomach under distended limiting assessment. No small bowel dilation or acute small bowel process. Normal appendix.  Scattered colonic diverticulosis. Vascular/Lymphatic: Calcified atheromatous plaque of the abdominal aorta without aneurysmal dilation There is no gastrohepatic or hepatoduodenal ligament lymphadenopathy. No retroperitoneal or mesenteric lymphadenopathy. No pelvic sidewall lymphadenopathy. Reproductive: Post hysterectomy without adnexal mass. Other: No ascites.  No free air. Musculoskeletal: Spinal degenerative changes. No acute or destructive bone process. IMPRESSION: 1. Large calculus at the LEFT UPJ measures 11 x 6 mm and is associated with moderate hydronephrosis and marked perinephric stranding and edema. 2. Scattered colonic diverticulosis without evidence of acute diverticulitis. 3. Aortic atherosclerosis. Aortic Atherosclerosis (ICD10-I70.0). Electronically Signed   By: Donzetta Kohut M.D.   On: 04/10/2020 09:32   DG Retrograde Pyelogram  Result Date: 04/11/2020 CLINICAL DATA:  Left-sided retrograde pyelogram EXAM: RETROGRADE PYELOGRAM COMPARISON:  CT abdomen pelvis-earlier same day FLUOROSCOPY TIME:  30 seconds (6.2 mGy) FINDINGS: Two spot intraoperative fluoroscopic images of the left upper abdominal quadrant are provided for review. Provided images demonstrate selective cannulation and opacification of the left ureter. A nonocclusive filling defect is seen within the left renal pelvis compatible with known  pelvic stone. Minimal amount of pelvicaliectasis, similar to preceding abdominal CT. IMPRESSION: Left-sided retrograde pyelogram demonstrates a suspected left pelvic stone and mild left-sided pelvicaliectasis, similar to preceding abdominal CT. Electronically Signed   By: Simonne Come M.D.   On: 04/11/2020 07:41     Labs:   Basic Metabolic Panel: Recent Labs  Lab 04/10/20 0817 04/10/20 0817 04/10/20 1049 04/11/20 0353 04/12/20 0122  NA 137  --   --  140 139  K 3.8   < >  --  4.2 3.8  CL 103  --   --  109 107  CO2 24  --   --  23 25  GLUCOSE 157*  --   --  295* 106*  BUN 16  --   --  11 16  CREATININE 1.10*  --   --  1.09* 0.85  CALCIUM 9.5  --   --  9.2 9.0  MG  --   --  2.0  --   --    < > = values in this interval not displayed.   GFR Estimated Creatinine Clearance: 60.2 mL/min (by C-G formula based on SCr of 0.85 mg/dL). Liver Function Tests: Recent Labs  Lab 04/10/20 0817 04/11/20 0353  AST 30 23  ALT 31 23  ALKPHOS 77 65  BILITOT 0.4 0.5  PROT 6.9 5.9*  ALBUMIN 3.8 3.0*   Recent Labs  Lab 04/10/20 0817  LIPASE 34   No results for input(s): AMMONIA in the last 168 hours. Coagulation profile No results for input(s): INR, PROTIME in the last 168 hours.  CBC: Recent Labs  Lab 04/10/20 0817 04/11/20 0353 04/12/20 0122  WBC 11.1* 7.0 10.0  NEUTROABS 8.9*  --   --   HGB 14.4 12.9 12.5  HCT 43.3 38.5 37.7  MCV 97.3 96.7 97.2  PLT 228 194 187   Cardiac Enzymes: No results for input(s): CKTOTAL, CKMB, CKMBINDEX, TROPONINI in the last 168 hours. BNP: Invalid input(s): POCBNP CBG: No results for input(s): GLUCAP in the last 168 hours. D-Dimer No results for input(s): DDIMER in the last 72 hours. Hgb A1c No results for input(s): HGBA1C in the last 72 hours. Lipid Profile No results for input(s): CHOL, HDL, LDLCALC, TRIG, CHOLHDL, LDLDIRECT in the last 72 hours. Thyroid function studies No results for input(s): TSH, T4TOTAL, T3FREE, THYROIDAB in the  last 72 hours.  Invalid input(s): FREET3 Anemia work up No results for input(s): VITAMINB12, FOLATE, FERRITIN, TIBC, IRON, RETICCTPCT in the last 72 hours. Microbiology Recent Results (from the past 240 hour(s))  Urine culture     Status: Abnormal   Collection Time: 04/10/20  8:17 AM   Specimen: Urine, Random  Result Value Ref Range Status   Specimen Description URINE, RANDOM  Final   Special Requests   Final    NONE Performed at [facility], [address] [place], [place] [address]    Culture MULTIPLE SPECIES PRESENT, SUGGEST RECOLLECTION (A)  Final   Report Status 04/11/2020 FINAL  Final  Respiratory Panel by RT PCR (Flu A&B, Covid) - Nasopharyngeal Swab     Status: None   Collection Time: 04/10/20 10:02 AM   Specimen: Nasopharyngeal Swab  Result Value Ref Range Status   SARS Coronavirus 2 by RT PCR NEGATIVE NEGATIVE Final    Comment: (NOTE) SARS-CoV-2 target nucleic acids are NOT DETECTED.  The SARS-CoV-2 RNA is generally detectable in upper respiratoy specimens during the acute phase of infection. The lowest concentration of SARS-CoV-2 viral copies this assay can detect is 131 copies/mL. A negative result does not preclude SARS-Cov-2 infection and should not be used as the sole basis for treatment or other patient management decisions. A negative result may occur with  improper specimen collection/handling, submission of specimen other than nasopharyngeal swab, presence of viral mutation(s) within the areas targeted by this assay, and inadequate number of viral copies (<131 copies/mL). A negative result must be combined with clinical observations, patient history, and epidemiological information. The expected result is Negative.  Fact Sheet for Patients:  https://www.moore.com/  Fact Sheet for Healthcare Providers:  https://www.young.biz/  This test is no t yet approved or cleared by the [place] FDA and  has been  authorized for detection and/or diagnosis of SARS-CoV-2 by FDA under an Emergency Use Authorization (EUA). This EUA will remain  in effect (meaning this test can be used) for the duration of the COVID-19 declaration under Section 564(b)(1) of the Act, 21 U.S.C. section 360bbb-3(b)(1), unless the authorization is terminated or revoked sooner.     Influenza A by PCR NEGATIVE NEGATIVE Final   Influenza B by PCR NEGATIVE NEGATIVE Final    Comment: (NOTE) The Xpert Xpress SARS-CoV-2/FLU/RSV assay is intended as an aid in  the diagnosis of influenza from Nasopharyngeal swab specimens and  should not be used as a sole basis for treatment. Nasal washings and  aspirates are unacceptable for Xpert Xpress SARS-CoV-2/FLU/RSV  testing.  Fact Sheet for Patients: https://www.moore.com/  Fact Sheet for Healthcare Providers: https://www.young.biz/  This test is not yet approved or cleared by the [place] and  has been authorized for  detection and/or diagnosis of SARS-CoV-2 by  FDA under an Emergency Use Authorization (EUA). This EUA will remain  in effect (meaning this test can be used) for the duration of the  Covid-19 declaration under Section 564(b)(1) of the Act, 21  U.S.C. section 360bbb-3(b)(1), unless the authorization is  terminated or revoked. Performed at [facility], [address] [place], [place] [address]   Urine Culture     Status: None   Collection Time: 04/10/20  6:58 PM   Specimen: Urine, Random  Result Value Ref Range Status   Specimen Description URINE, RANDOM LEFT RENAL PELVIS  Final   Special Requests NONE  Final   Culture   Final    NO GROWTH Performed at [facility], [address] [place], [place] [address]    Report Status 04/12/2020 FINAL  Final  Anaerobic culture     Status: None (Preliminary result)   Collection Time: 04/10/20  6:58 PM   Specimen: Urine, Random  Result Value Ref Range Status    Specimen Description URINE, RANDOM EFT RENAL PELVIS  Final   Special Requests NONE  Final   Gram Stain   Final    NO WBC SEEN NO ORGANISMS SEEN Performed at [facility], [address] [place], [place] [address]    Culture PENDING  Incomplete   Report Status PENDING  Incomplete     Discharge Instructions:   Discharge Instructions    No wound care   Complete by: As directed      Allergies as of 04/13/2020      Reactions   Contrast Media [iodinated Diagnostic Agents]    unknown   Iodine    Penicillins    Shellfish Allergy       Medication List    TAKE these medications   ALPRAZolam 0.25 MG tablet Commonly known as: XANAX Take 1 tablet (0.25 mg total) by mouth 2 (two) times daily as needed for anxiety.   amLODipine 5 MG tablet Commonly known as: NORVASC Take 5 mg by mouth daily.   aspirin-acetaminophen-caffeine 250-250-65 MG tablet Commonly known as: EXCEDRIN MIGRAINE Take 1 tablet by mouth every 6 (six) hours as needed for headache.   bisacodyl 10 MG suppository Commonly known as: DULCOLAX Place 1 suppository (10 mg total) rectally daily as needed for mild constipation.   docusate sodium 100 MG capsule Commonly known as: Colace Take 1 capsule (100 mg total) by mouth daily as needed.   lisinopril 20 MG tablet Commonly known as: ZESTRIL Take 40 mg by mouth daily.   oxyCODONE-acetaminophen 5-325 MG tablet Commonly known as: Percocet Take 1 tablet by mouth every 4 (four) hours as needed for up to 12 doses for severe pain.   Pataday 0.1 % ophthalmic solution Generic drug: olopatadine Place 1 drop into both eyes 2 (two) times daily as needed for allergies.   tamsulosin 0.4 MG Caps capsule Commonly known as: Flomax Take 1 capsule (0.4 mg total) by mouth daily.       Follow-up Information    [facility] In 1 week.   Contact information: [address] [place] [phone]               Time  coordinating discharge: 35 min  Signed:  Joseph ArtJessica U Ginnie Marich DO  [facility] 04/13/2020, 11:28 AM

## 2020-04-13 NOTE — Progress Notes (Signed)
Dr Benjamine Mola reexamined and reassured pt. Md  reassured pt/daughter of symptoms typical to her condition and tx. No new orders received from md. Md states ok for pt to be discharged

## 2020-04-13 NOTE — Progress Notes (Signed)
Pt/daughter concerned for discomfort llq and abdominal distention, pt prefers to take suppository once home. Difficult for pt to characterize discomfort stating "maybe achy". Pt specifies discomfort associated urination and occasional activity movements. Pt/daughter deferring pain med till reexamined by Dr Benjamine Mola.

## 2020-04-13 NOTE — Discharge Instructions (Signed)
   Activity:  You are encouraged to ambulate frequently (about every hour during waking hours) to help prevent blood clots from forming in your legs or lungs.  However, you should not engage in any heavy lifting (> 10-15 lbs), strenuous activity, or heavy straining.   Diet: You should advance your diet as instructed by your physician.  It will be normal to have some bloating, nausea, and abdominal discomfort intermittently.   Prescriptions:  You will be provided a prescription for pain medication to take as needed.  If your pain is not severe enough to require the prescription pain medication, you may take extra strength Tylenol instead which will have less side effects.  You should also take a prescribed stool softener to avoid straining with bowel movements as the prescription pain medication may constipate you.   What to call us about: You should call the office [phone]) if you develop fever > 101 or develop persistent vomiting. Activity:  You are encouraged to ambulate frequently (about every hour during waking hours) to help prevent blood clots from forming in your legs or lungs.  However, you should not engage in any heavy lifting (> 10-15 lbs), strenuous activity, or heavy straining.  You have a left ureteral stent in place. This is temporary and must be removed in 3 months. Follow up with [facility] or in [place] with a urologist for definitive treatment of stones.

## 2020-04-14 ENCOUNTER — Other Ambulatory Visit (HOSPITAL_COMMUNITY)
Admission: RE | Admit: 2020-04-14 | Discharge: 2020-04-14 | Disposition: A | Discharge status: Home / Self Care | Payer: Medicare Other | Source: Ambulatory Visit | Attending: Urology | Admitting: Urology

## 2020-04-14 ENCOUNTER — Other Ambulatory Visit: Payer: Self-pay | Admitting: Urology

## 2020-04-14 DIAGNOSIS — Z01812 Encounter for preprocedural laboratory examination: Secondary | ICD-10-CM | POA: Insufficient documentation

## 2020-04-14 DIAGNOSIS — Z20822 Contact with and (suspected) exposure to covid-19: Secondary | ICD-10-CM | POA: Insufficient documentation

## 2020-04-14 LAB — SARS CORONAVIRUS 2 (TAT 6-24 HRS): SARS Coronavirus 2: NEGATIVE

## 2020-04-15 NOTE — Progress Notes (Signed)
Patient to arrive at 0915 on 04/17/2020/ History and medications reviewed. Pre-procedure instructions given. NPO after MN Wednesday except for clear liquids until 0715 and BP medications AM of ESWL. Driver secured.

## 2020-04-16 LAB — ANAEROBIC CULTURE: Gram Stain: NONE SEEN

## 2020-04-17 ENCOUNTER — Other Ambulatory Visit: Payer: Self-pay

## 2020-04-17 ENCOUNTER — Ambulatory Visit (HOSPITAL_BASED_OUTPATIENT_CLINIC_OR_DEPARTMENT_OTHER)
Admission: RE | Admit: 2020-04-17 | Discharge: 2020-04-17 | Disposition: A | Discharge status: Home / Self Care | Payer: Medicare Other | Attending: Urology | Admitting: Urology

## 2020-04-17 ENCOUNTER — Ambulatory Visit (HOSPITAL_COMMUNITY): Payer: Medicare Other

## 2020-04-17 ENCOUNTER — Encounter (HOSPITAL_BASED_OUTPATIENT_CLINIC_OR_DEPARTMENT_OTHER): Payer: Self-pay | Admitting: Urology

## 2020-04-17 ENCOUNTER — Encounter (HOSPITAL_BASED_OUTPATIENT_CLINIC_OR_DEPARTMENT_OTHER)
Admission: RE | Disposition: A | Discharge status: Home / Self Care | Payer: Self-pay | Source: Home / Self Care | Attending: Urology

## 2020-04-17 DIAGNOSIS — Z880 Allergy status to penicillin: Secondary | ICD-10-CM | POA: Insufficient documentation

## 2020-04-17 DIAGNOSIS — Z888 Allergy status to other drugs, medicaments and biological substances status: Secondary | ICD-10-CM | POA: Diagnosis not present

## 2020-04-17 DIAGNOSIS — N323 Diverticulum of bladder: Secondary | ICD-10-CM | POA: Insufficient documentation

## 2020-04-17 DIAGNOSIS — E669 Obesity, unspecified: Secondary | ICD-10-CM | POA: Diagnosis not present

## 2020-04-17 DIAGNOSIS — N179 Acute kidney failure, unspecified: Secondary | ICD-10-CM | POA: Insufficient documentation

## 2020-04-17 DIAGNOSIS — Z8249 Family history of ischemic heart disease and other diseases of the circulatory system: Secondary | ICD-10-CM | POA: Diagnosis not present

## 2020-04-17 DIAGNOSIS — Z90710 Acquired absence of both cervix and uterus: Secondary | ICD-10-CM | POA: Insufficient documentation

## 2020-04-17 DIAGNOSIS — N132 Hydronephrosis with renal and ureteral calculous obstruction: Secondary | ICD-10-CM | POA: Diagnosis present

## 2020-04-17 DIAGNOSIS — Z6829 Body mass index (BMI) 29.0-29.9, adult: Secondary | ICD-10-CM | POA: Insufficient documentation

## 2020-04-17 DIAGNOSIS — I10 Essential (primary) hypertension: Secondary | ICD-10-CM | POA: Insufficient documentation

## 2020-04-17 DIAGNOSIS — Z79899 Other long term (current) drug therapy: Secondary | ICD-10-CM | POA: Insufficient documentation

## 2020-04-17 DIAGNOSIS — Z91041 Radiographic dye allergy status: Secondary | ICD-10-CM | POA: Diagnosis not present

## 2020-04-17 DIAGNOSIS — Z91013 Allergy to seafood: Secondary | ICD-10-CM | POA: Insufficient documentation

## 2020-04-17 DIAGNOSIS — Z20822 Contact with and (suspected) exposure to covid-19: Secondary | ICD-10-CM | POA: Insufficient documentation

## 2020-04-17 DIAGNOSIS — Z7982 Long term (current) use of aspirin: Secondary | ICD-10-CM | POA: Diagnosis not present

## 2020-04-17 DIAGNOSIS — N200 Calculus of kidney: Secondary | ICD-10-CM

## 2020-04-17 HISTORY — PX: EXTRACORPOREAL SHOCK WAVE LITHOTRIPSY: SHX1557

## 2020-04-17 SURGERY — LITHOTRIPSY, ESWL
Anesthesia: LOCAL | Laterality: Left

## 2020-04-17 MED ORDER — CIPROFLOXACIN HCL 500 MG PO TABS
ORAL_TABLET | ORAL | Status: AC
  Filled 2020-04-17: qty 1

## 2020-04-17 MED ORDER — SODIUM CHLORIDE 0.9 % IV SOLN: INTRAVENOUS | Status: DC

## 2020-04-17 MED ORDER — DIAZEPAM 5 MG PO TABS
10.0000 mg | ORAL_TABLET | ORAL | Status: AC
  Administered 2020-04-17: 10 mg via ORAL

## 2020-04-17 MED ORDER — ONDANSETRON HCL 4 MG/2ML IJ SOLN
INTRAMUSCULAR | Status: AC
  Filled 2020-04-17: qty 2

## 2020-04-17 MED ORDER — DIPHENHYDRAMINE HCL 25 MG PO CAPS: 25.0000 mg | ORAL_CAPSULE | ORAL | Status: DC

## 2020-04-17 MED ORDER — DIAZEPAM 5 MG PO TABS
ORAL_TABLET | ORAL | Status: AC
  Filled 2020-04-17: qty 2

## 2020-04-17 MED ORDER — ONDANSETRON HCL 4 MG/2ML IJ SOLN
4.0000 mg | Freq: Once | INTRAMUSCULAR | Status: AC
  Administered 2020-04-17: 4 mg via INTRAVENOUS

## 2020-04-17 MED ORDER — CIPROFLOXACIN HCL 500 MG PO TABS: 500.0000 mg | ORAL_TABLET | Freq: Once | ORAL | Status: DC

## 2020-04-17 MED ORDER — DIPHENHYDRAMINE HCL 25 MG PO CAPS
ORAL_CAPSULE | ORAL | Status: AC
  Filled 2020-04-17: qty 1

## 2020-04-17 NOTE — Op Note (Signed)
See [facility] operative note scanned into chart. Also because of the size, density, location and other factors that cannot be anticipated I feel this will likely be a staged procedure. This fact supersedes any indication in the scanned [facility] operative note to the contrary.  

## 2020-04-17 NOTE — Interval H&P Note (Signed)
History and Physical Interval Note:  04/17/2020 11:11 AM  Lisa Parsons  has presented today for surgery, with the diagnosis of left ureteral pelvic junction  stone.  The various methods of treatment have been discussed with the patient and family. After consideration of risks, benefits and other options for treatment, the patient has consented to  Procedure(s): LEFT EXTRACORPOREAL SHOCK WAVE LITHOTRIPSY (ESWL) (Left) as a surgical intervention.  The patient's history has been reviewed, patient examined, no change in status, stable for surgery.  I have reviewed the patient's chart and labs.  Questions were answered to the patient's satisfaction.  I answered numerous questions for her and her daughter this morning until all questions were answered to their stated satisfaction.   [employer]

## 2020-04-17 NOTE — Discharge Instructions (Signed)
Lithotripsy, Care After This sheet gives you information about how to care for yourself after your procedure. Your health care provider may also give you more specific instructions. If you have problems or questions, contact your health care provider. What can I expect after the procedure? After the procedure, it is common to have:  Some blood in your urine. This should only last for a few days.  Soreness in your back, sides, or upper abdomen for a few days.  Blotches or bruises on your back where the pressure wave entered the skin.  Pain, discomfort, or nausea when pieces (fragments) of the kidney [place] move through the tube that carries urine from the kidney to the bladder (ureter). [place] fragments may pass soon after the procedure, but they may continue to pass for up to 4-8 weeks. ? If you have severe pain or nausea, contact your health care provider. This may be caused by a large [place] that was not broken up, and this may mean that you need more treatment.  Some pain or discomfort during urination.  Some pain or discomfort in the lower abdomen or (in men) at the base of the penis. Follow these instructions at home: Medicines  Take over-the-counter and prescription medicines only as told by your health care provider.  If you were prescribed an antibiotic medicine, take it as told by your health care provider. Do not stop taking the antibiotic even if you start to feel better.  Do not drive for 24 hours if you were given a medicine to help you relax (sedative).  Do not drive or use heavy machinery while taking prescription pain medicine. Eating and drinking      Drink enough water and fluids to keep your urine clear or pale yellow. This helps any remaining pieces of the [place] to pass. It can also help prevent new stones from forming.  Eat plenty of fresh fruits and vegetables.  Follow instructions from your health care provider about eating and drinking restrictions. You may be  instructed: ? To reduce how much salt (sodium) you eat or drink. Check ingredients and nutrition facts on packaged foods and beverages. ? To reduce how much meat you eat.  Eat the recommended amount of calcium for your age and gender. Ask your health care provider how much calcium you should have. General instructions  Get plenty of rest.  Most people can resume normal activities 1-2 days after the procedure. Ask your health care provider what activities are safe for you.  Your health care provider may direct you to lie in a certain position (postural drainage) and tap firmly (percuss) over your kidney area to help [place] fragments pass. Follow instructions as told by your health care provider.  If directed, strain all urine through the strainer that was provided by your health care provider. ? Keep all fragments for your health care provider to see. Any stones that are found may be sent to a medical lab for examination. The [place] may be as small as a grain of salt.  Keep all follow-up visits as told by your health care provider. This is important. Contact a health care provider if:  You have pain that is severe or does not get better with medicine.  You have nausea that is severe or does not go away.  You have blood in your urine longer than your health care provider told you to expect.  You have more blood in your urine.  You have pain during urination that does   not go away.  You urinate more frequently than usual and this does not go away.  You develop a rash or any other possible signs of an allergic reaction. Get help right away if:  You have severe pain in your back, sides, or upper abdomen.  You have severe pain while urinating.  Your urine is very dark red.  You have blood in your stool (feces).  You cannot pass any urine at all.  You feel a strong urge to urinate after emptying your bladder.  You have a fever or chills.  You develop shortness of breath,  difficulty breathing, or chest pain.  You have severe nausea that leads to persistent vomiting.  You faint. Summary  After this procedure, it is common to have some pain, discomfort, or nausea when pieces (fragments) of the kidney stone move through the tube that carries urine from the kidney to the bladder (ureter). If this pain or nausea is severe, however, you should contact your health care provider.  Most people can resume normal activities 1-2 days after the procedure. Ask your health care provider what activities are safe for you.  Drink enough water and fluids to keep your urine clear or pale yellow. This helps any remaining pieces of the stone to pass, and it can help prevent new stones from forming.  If directed, strain your urine and keep all fragments for your health care provider to see. Fragments or stones may be as small as a grain of salt.  Get help right away if you have severe pain in your back, sides, or upper abdomen or have severe pain while urinating. This information is not intended to replace advice given to you by your health care provider. Make sure you discuss any questions you have with your health care provider. Document Revised: 09/18/2018 Document Reviewed: 04/28/2016 Elsevier Patient Education  2020 [employer] Inc. 1. You should strain your urine and collect all fragments and bring them to your follow up appointment.  2. You should take your pain medication as needed.  Please call if your pain is severe to the point that it is not controlled with your pain medication. 3. You should call if you develop fever > 101 or persistent nausea or vomiting. 4. Your doctor may prescribe tamsulosin to take to help facilitate stone passage.  

## 2020-04-18 ENCOUNTER — Encounter (HOSPITAL_BASED_OUTPATIENT_CLINIC_OR_DEPARTMENT_OTHER): Payer: Self-pay | Admitting: Urology

## 2021-04-27 IMAGING — CT CT ABD-PELV W/O CM
2 of 4 series · 16 of 46 positions shown, 18 images · non-contrast
Comparison: None

CLINICAL DATA: 74-year-old female with suspected diverticulitis,
acute onset of abdominal pain most notably in the LEFT lower
quadrant

EXAM:
CT ABDOMEN AND PELVIS WITHOUT CONTRAST
TECHNIQUE: Multidetector CT imaging of the abdomen and pelvis was performed
following the standard protocol without IV contrast.

[Series 3: a/p w/o 5mm · axial · non-contrast · 0.92mm/px · z∈[+814,+1250]mm · 13 of 95 slices shown, 15 images]
[im 4/95  soft-tissue]
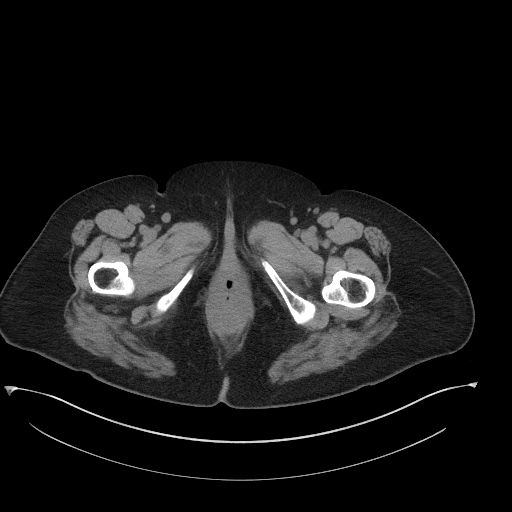
[im 4/95  bone]
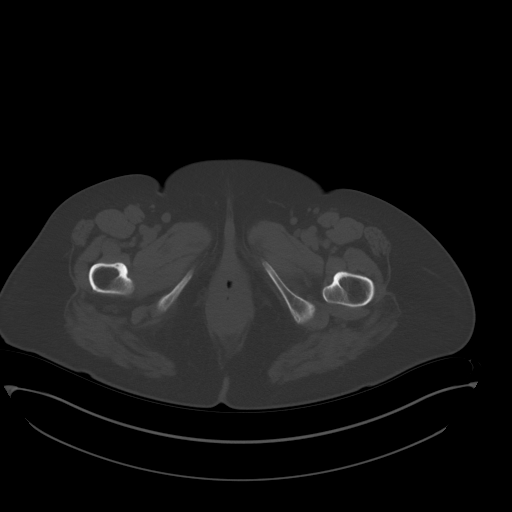
[im 12/95  soft-tissue]
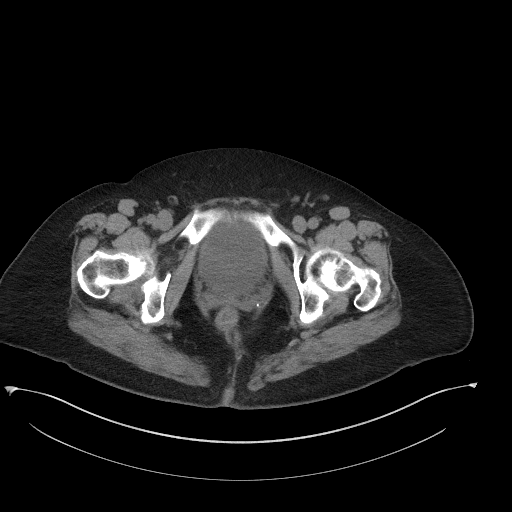
[im 20/95  soft-tissue]
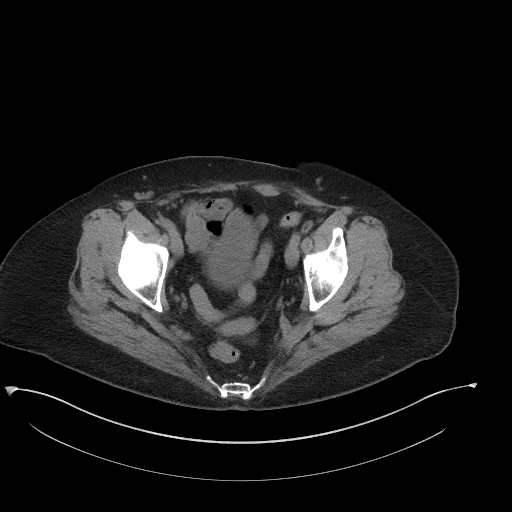
[im 28/95  soft-tissue]
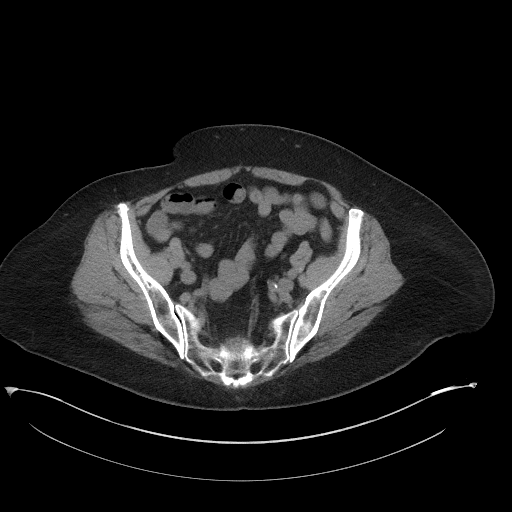
[im 32/95  soft-tissue]
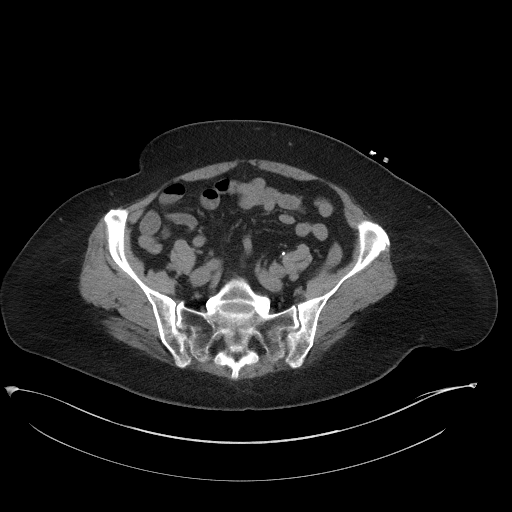
[im 40/95  soft-tissue]
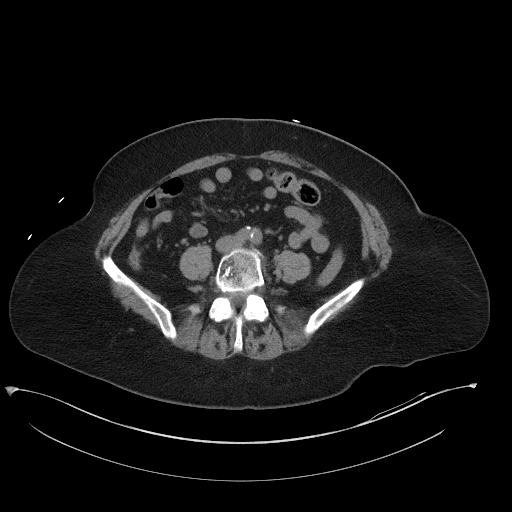
[im 48/95  soft-tissue]
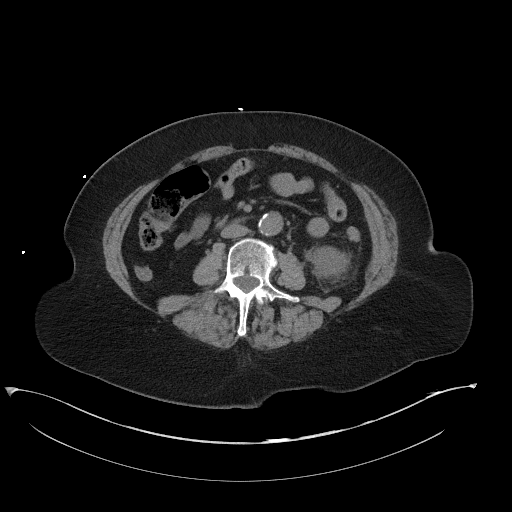
[im 55/95  soft-tissue]
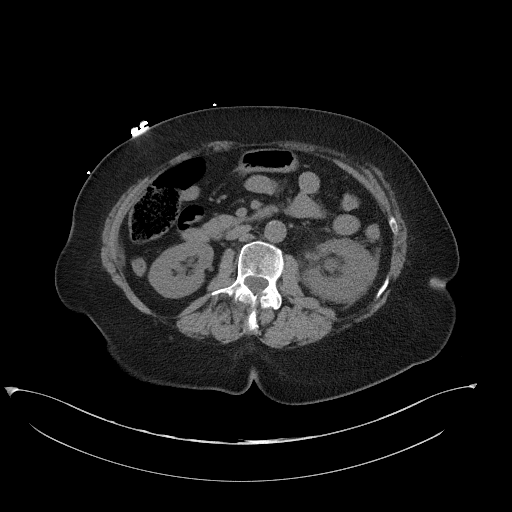
[im 63/95  soft-tissue]
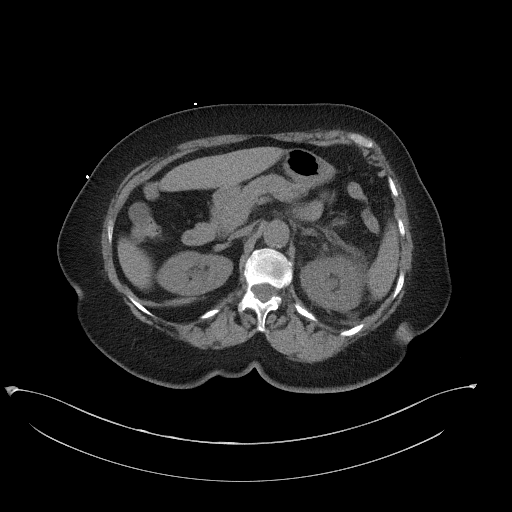
[im 63/95  bone]
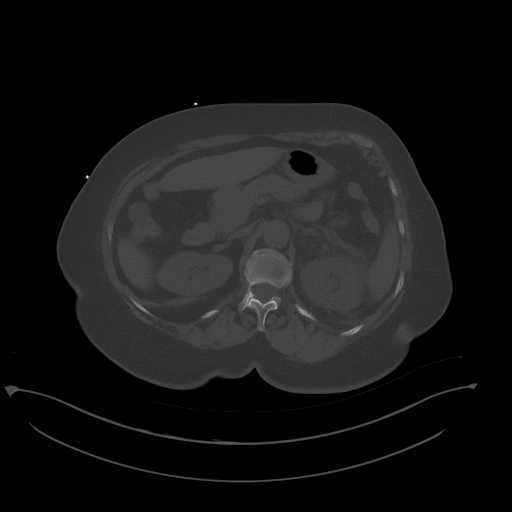
[im 67/95  soft-tissue]
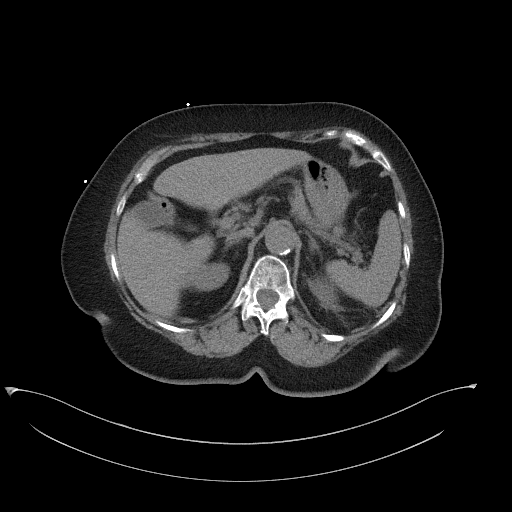
[im 75/95  soft-tissue]
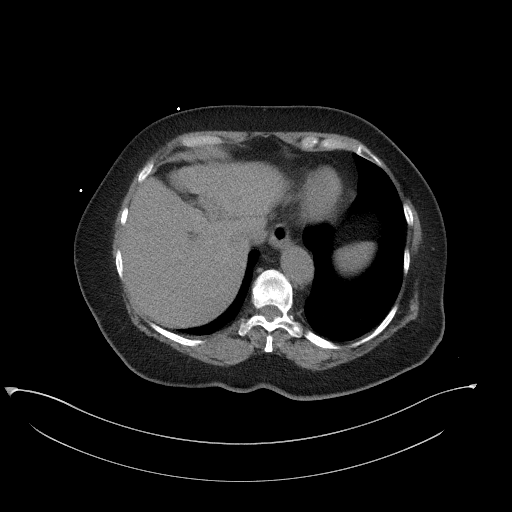
[im 83/95  soft-tissue]
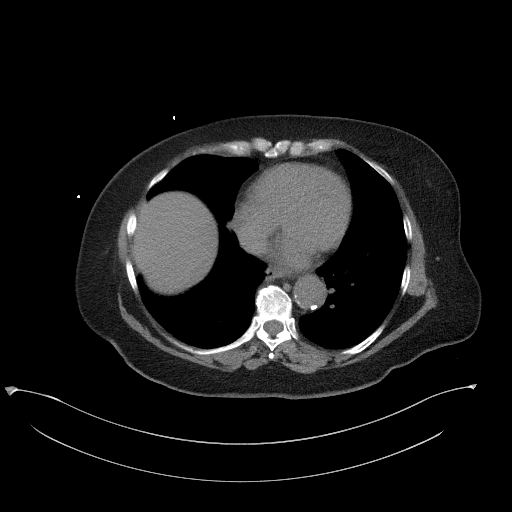
[im 91/95  soft-tissue]
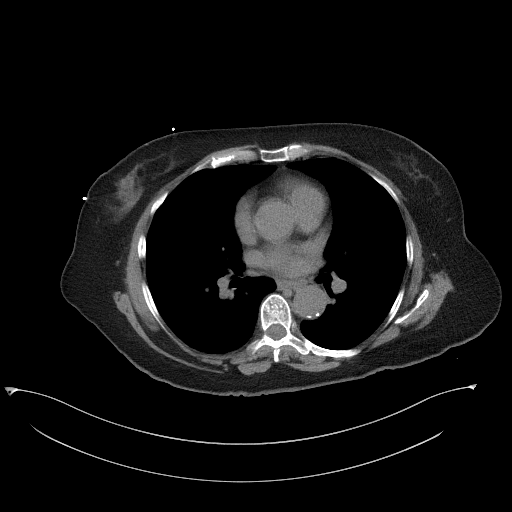

[Series 6: a/p w/o cor · coronal · non-contrast · 0.81mm/px · 3 of 151 slices shown]
[im 51/151  soft-tissue]
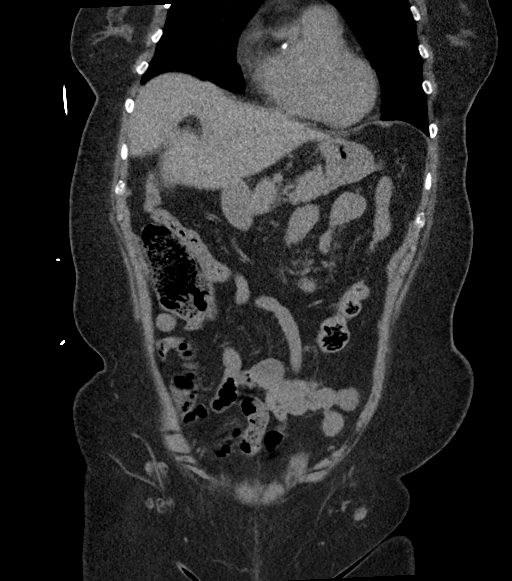
[im 67/151  soft-tissue]
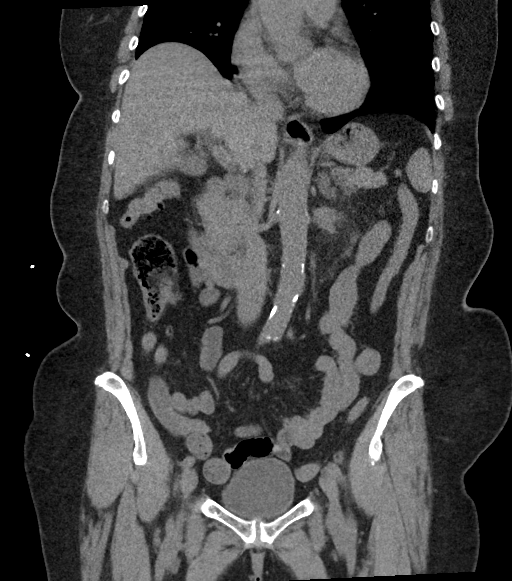
[im 84/151  soft-tissue]
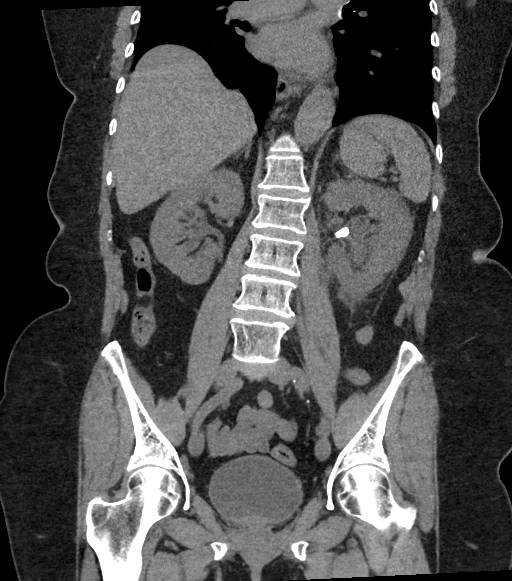

[16 of 46 positions shown; findings below may reference images not displayed]

FINDINGS: Lower chest: Minimal basilar atelectasis. No consolidation. No
pleural effusion. Atherosclerotic plaque in the thoracic aorta.

Hepatobiliary: Liver is unremarkable on noncontrast imaging with
normal contour and size. No pericholecystic stranding. No gross
biliary duct dilation.

Pancreas: Pancreas normal without ductal dilation or sign of
inflammation.

Spleen: Spleen normal in size and contour.

Adrenals/Urinary Tract: Adrenal glands are normal.

RIGHT kidney is normal without signs of nephrolithiasis or
hydronephrosis.

Large calculus at the LEFT UPJ measures 11 x 6 mm and is associated
with moderate hydronephrosis and marked perinephric stranding and
edema. No additional renal or ureteral calculi on the LEFT.

Small LEFT posterolateral urinary bladder diverticulum.

Stomach/Bowel: Small hiatal hernia. Stomach under distended limiting
assessment. No small bowel dilation or acute small bowel process.

Normal appendix.  Scattered colonic diverticulosis.

Vascular/Lymphatic: Calcified atheromatous plaque of the abdominal
aorta without aneurysmal dilation There is no gastrohepatic or
hepatoduodenal ligament lymphadenopathy. No retroperitoneal or
mesenteric lymphadenopathy.

No pelvic sidewall lymphadenopathy.

Reproductive: Post hysterectomy without adnexal mass.

Other: No ascites.  No free air.

Musculoskeletal: Spinal degenerative changes. No acute or
destructive bone process.
IMPRESSION: 1. Large calculus at the LEFT UPJ measures 11 x 6 mm and is
associated with moderate hydronephrosis and marked perinephric
stranding and edema.
2. Scattered colonic diverticulosis without evidence of acute
diverticulitis.
3. Aortic atherosclerosis.

Aortic Atherosclerosis (V5XC4-X61.1).

## 2021-05-04 IMAGING — DX DG ABDOMEN 1V
1 series · 1 of 1 positions shown · non-contrast
Comparison: 04/10/2020

CLINICAL DATA: Left renal calculus

EXAM:
ABDOMEN - 1 VIEW

[abdomen kub]
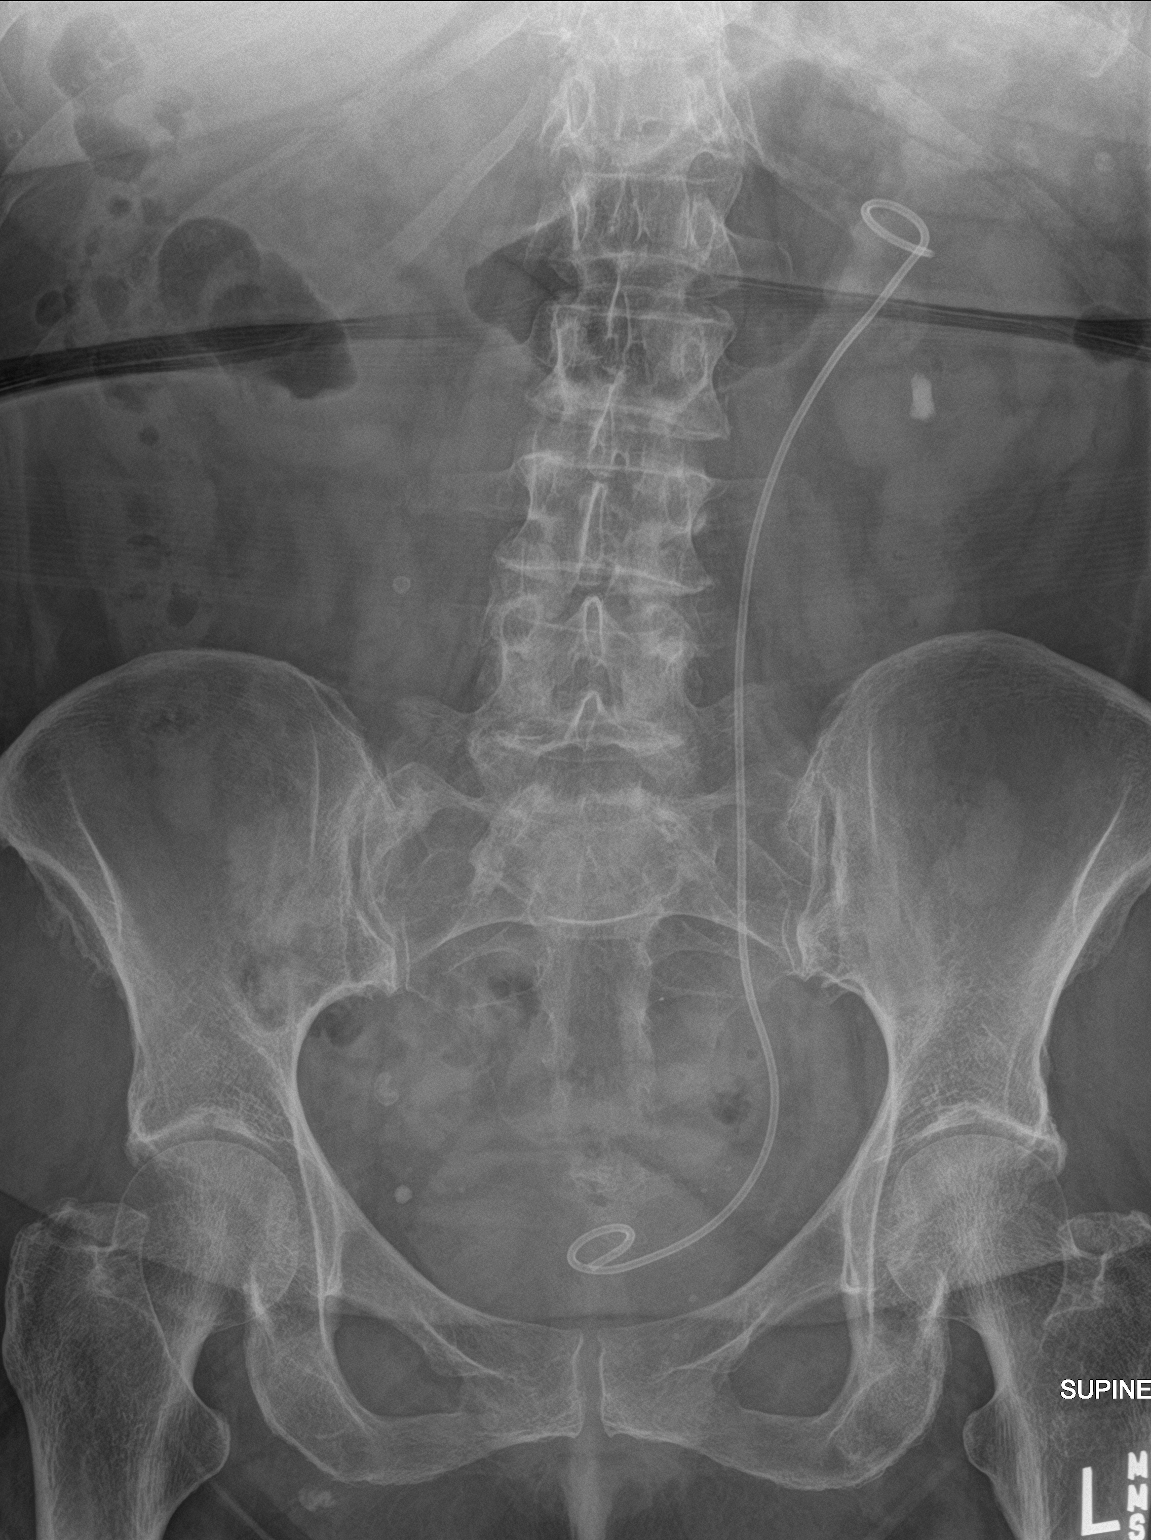

[1 of 1 positions shown; findings below may reference images not displayed]

FINDINGS: Supine frontal view of the abdomen and pelvis excludes the
hemidiaphragms by collimation. Left ureteral stent extends from the
upper pole left kidney to the bladder. 13 x 7 mm calculus overlies
lower pole left renal silhouette. Multiple vascular calcifications
are seen within the right hemiabdomen and pelvis. No bowel
obstruction or ileus. Mild left convex scoliosis at the
thoracolumbar junction.
IMPRESSION: 1. Left ureteral stent as above, with stable appearance of the
nonobstructing lower pole left renal calculus.
